# Patient Record
Sex: Female | Born: 1940 | Race: White | Hispanic: No | Smoking: Never smoker
Health system: Southern US, Community
[De-identification: ages and names within clinical notes are randomized; demographics above are authoritative.]

## PROBLEM LIST (undated history)

## (undated) DIAGNOSIS — E7800 Pure hypercholesterolemia, unspecified: Secondary | ICD-10-CM

## (undated) HISTORY — PX: APPENDECTOMY: SHX54

## (undated) HISTORY — DX: Pure hypercholesterolemia, unspecified: E78.00

## (undated) HISTORY — PX: BILATERAL ANTERIOR TOTAL HIP ARTHROPLASTY: SHX5567

---
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
## 1972-07-22 HISTORY — PX: TOTAL ABDOMINAL HYSTERECTOMY: SHX209

## 1972-07-22 HISTORY — PX: OTHER SURGICAL HISTORY: SHX169

## 2004-07-04 ENCOUNTER — Ambulatory Visit: Payer: Self-pay | Admitting: Family Medicine

## 2005-01-15 ENCOUNTER — Ambulatory Visit: Payer: Self-pay | Admitting: Family Medicine

## 2006-02-20 ENCOUNTER — Ambulatory Visit: Payer: Self-pay | Admitting: Family Medicine

## 2007-03-24 DIAGNOSIS — K5900 Constipation, unspecified: Secondary | ICD-10-CM | POA: Insufficient documentation

## 2007-04-14 ENCOUNTER — Ambulatory Visit: Payer: Self-pay | Admitting: Family Medicine

## 2007-05-05 ENCOUNTER — Ambulatory Visit: Payer: Self-pay | Admitting: General Surgery

## 2007-05-05 LAB — HM COLONOSCOPY: HM COLON: NORMAL

## 2008-05-20 ENCOUNTER — Ambulatory Visit: Payer: Self-pay | Admitting: Family Medicine

## 2008-06-22 ENCOUNTER — Ambulatory Visit: Payer: Self-pay | Admitting: Family Medicine

## 2008-07-20 ENCOUNTER — Ambulatory Visit: Payer: Self-pay | Admitting: Family Medicine

## 2009-07-27 ENCOUNTER — Ambulatory Visit: Payer: Self-pay | Admitting: Emergency Medicine

## 2009-08-10 ENCOUNTER — Ambulatory Visit: Payer: Self-pay | Admitting: Family Medicine

## 2010-08-09 ENCOUNTER — Encounter: Payer: Self-pay | Admitting: Cardiovascular Disease

## 2010-09-05 ENCOUNTER — Encounter: Payer: Self-pay | Admitting: Cardiovascular Disease

## 2010-09-05 ENCOUNTER — Ambulatory Visit (INDEPENDENT_AMBULATORY_CARE_PROVIDER_SITE_OTHER): Payer: Medicare Other | Admitting: Cardiovascular Disease

## 2010-09-05 DIAGNOSIS — R9431 Abnormal electrocardiogram [ECG] [EKG]: Secondary | ICD-10-CM

## 2010-09-05 DIAGNOSIS — Z01810 Encounter for preprocedural cardiovascular examination: Secondary | ICD-10-CM

## 2010-09-12 ENCOUNTER — Ambulatory Visit: Payer: Self-pay | Admitting: Family Medicine

## 2010-09-12 NOTE — Assessment & Plan Note (Signed)
Summary: NP6/SURGICAL CLEARANCE FOR A HIP REPLACEMENT/AMD   Visit Type:  Initial Consult Primary Provider:  Dr. Annye Asa  CC:  clearance for hip surgery in March.c/o "heart feeling slow". denies chest pain and SOB.  History of Present Illness: very pleasant 70 year old woman who presents by referral from Dr. Nilda Simmer, with abnormal EKG, atypical type chest discomfort, a strong family history of coronary artery disease who is scheduled to have a right hip replacement in the near future and requires preoperative evaluation.  Natalie Pena reports that overall she is doing well. She is typically very active though is limited by her right hip discomfort. Sometimes it does not cause significant discomfort, other times, it pops out and causes severe pain. She typically walks with a limp. She is very active around her house, and denies any significant shortness of breath or chest discomfort.  Rarely, she has episodes that she describes as a "weak heart ". She believes it is anxiety. It is not chest pain. She also has rare episodes of shortness of breath while at rest when she reads about and has to take a deep breath in.  She states her mother had significant coronary artery disease with first heart attack at age 61 though she was a smoker and diagnosed as a diabetic at that time.  Natalie Pena is unaware of her cholesterol numbers though reports she has a very elevated HDL.  EKG from August 09, 2010 shows normal sinus rhythm with rate 65 beats per minute, left axis deviation, poor R-wave progression through the anterior precordial leads.  EKG from today is essentially unchanged from above, normal sinus rhythm with rate 57 beats per minute, poor R-wave progression through the anterior precordial leads, left axis deviation  Current Medications (verified): 1)  Vitamin D 400 Unit Caps (Cholecalciferol) .... 2 Tablets Once Daily 2)  Aspir-Low 81 Mg Tbec (Aspirin) .Marland Kitchen.. 1 Tablet Once Daily 3)   Calcium-Vitamin D 600-200 Mg-Unit Tabs (Calcium-Vitamin D) .... 2 Tablets Once Daily 4)  Multivitamins  Tabs (Multiple Vitamin) .Marland Kitchen.. 1 Tablet Once Daily 5)  Advil 200 Mg Tabs (Ibuprofen) .... As Needed 6)  Fish Oil 1000 Mg Caps (Omega-3 Fatty Acids) .... 2 Tablets Once Daily 7)  Magnesium Gluconate 550 Mg Tabs (Magnesium Gluconate) .Marland Kitchen.. 1 Tablet Once Daily  Allergies (verified): 1)  ! * Shellfish  Past History:  Past Medical History: Last updated: 10/02/10 Family hx of cardiovascular disease Hypercholesterolemia Osteoporosis  Past Surgical History: Last updated: 10-02-2010 Tonsillectomy and adenoidectomy(1940) Appendectomy [phone]) Abdominal Hysterectomy-Total-1974 (fibroids) Unilateral oophorectomy(1974) left  Family History: Last updated: 10-02-10 Father: deceased-pancreatic cancer Mother: deceased-MI, CAD  Social History: Last updated: 02-Oct-2010 Retired  Widowed  Tobacco Use - No.  Alcohol Use - no Regular Exercise - yes Drug Use - no  Risk Factors: Exercise: yes (10-02-10)  Risk Factors: Smoking Status: never (October 02, 2010)  Review of Systems  The patient denies fever, weight loss, weight gain, vision loss, decreased hearing, hoarseness, chest pain, syncope, dyspnea on exertion, peripheral edema, prolonged cough, abdominal pain, incontinence, muscle weakness, depression, and enlarged lymph nodes.         rare feeling in her chest that she believes is secondary to anxiety, rare shortness of breath at rest, severe right hip pain with walking  Vital Signs:  Patient profile:   70 year old female Height:      64 inches Weight:      134.25 pounds BMI:     23.13 Pulse rate:   57 / minute BP  sitting:   164 / 82  (left arm) Cuff size:   regular  Vitals Entered By: Lysbeth Galas CMA (September 05, 2010 2:43 PM)  Physical Exam  General:  Well developed, well nourished, in no acute distress. Head:  normocephalic and atraumatic Neck:  Neck supple,  no JVD. No masses, thyromegaly or abnormal cervical nodes. Lungs:  Clear bilaterally to auscultation and percussion. Heart:  Non-displaced PMI, chest non-tender; regular rate and rhythm, S1, S2 without murmurs, rubs or gallops. Carotid upstroke normal, no bruit.  Pedals normal pulses. No edema, no varicosities. Abdomen:  Bowel sounds positive; abdomen soft and non-tender without masses Msk:  Back normal, normal gait. Muscle strength and tone normal. Pulses:  pulses normal in all 4 extremities Extremities:  No clubbing or cyanosis. Neurologic:  Alert and oriented x 3. Skin:  Intact without lesions or rashes. Psych:  Normal affect.   Impression & Recommendations:  Problem # 1:  PRE-OPERATIVE CARDIAC EXAM (ICD-V72.81) Natalie Pena would be acceptable risk for up coming right hip surgery. She does have a strong family history of coronary artery disease though her mother was a smoker and diabetic and our patient does not smoke or have diabetes diagnosed at this time. She is active with no symptoms and can easily perform more than 5 METS. She has no known coronary artery disease. Her EKG does have borderline abnormalities though nothing that would preclude her from the surgery or require additional workup.   We have suggested that she exercise as tolerated after her surgery. If she has symptoms of shortness of breath or chest discomfort, that she contact our office for further evaluation.  We have recommended that she take her aspirin through the surgery if acceptable for Dr. Hyacinth Meeker.  We will not start any beta blocker as her heart rate is already low. We will try to obtain her most recent lipid panel for our records given her strong family history.  Her updated medication list for this problem includes:    Aspir-low 81 Mg Tbec (Aspirin) .Marland Kitchen... 1 tablet once daily  Other Orders: EKG w/ Interpretation (93000)  Patient Instructions: 1)  Your physician recommends that you follow up as  needed. 2)  Your physician recommends that you continue on your current medications as directed. Please refer to the Current Medication list given to you today.

## 2010-10-02 ENCOUNTER — Ambulatory Visit: Payer: Self-pay | Admitting: Specialist

## 2010-10-09 NOTE — Letter (Signed)
Summary: [facility] Office Visit Note  [facility] Office Visit Note   Imported By: Roderic Ovens 10/02/2010 11:55:11  _____________________________________________________________________  External Attachment:    Type:   Image     Comment:   External Document

## 2010-10-10 ENCOUNTER — Inpatient Hospital Stay: Payer: Self-pay | Admitting: Specialist

## 2010-10-12 ENCOUNTER — Encounter: Payer: Self-pay | Admitting: Internal Medicine

## 2010-10-21 ENCOUNTER — Encounter: Payer: Self-pay | Admitting: Internal Medicine

## 2010-11-20 ENCOUNTER — Encounter: Payer: Self-pay | Admitting: Internal Medicine

## 2011-02-12 ENCOUNTER — Encounter: Payer: Self-pay | Admitting: Cardiovascular Disease

## 2011-11-13 ENCOUNTER — Ambulatory Visit: Payer: Self-pay | Admitting: Family Medicine

## 2011-11-13 LAB — HM DEXA SCAN

## 2011-11-14 IMAGING — CR DG CHEST 2V
1 series · 2 of 2 positions shown · non-contrast
Comparison: none

REASON FOR EXAM: occasional sob
COMMENTS:

PROCEDURE:     DXR - DXR CHEST PA (OR AP) AND LATERAL  - October 02, 2010 [DATE]
RESULT:     Comparison is made to the prior exam of 07/27/2009. The lung
fields are clear. The heart, mediastinal and osseous structures show no
significant abnormalities.

[Series 1: view not recorded · 0.17mm/px · 2 of 2 slices shown]
[im 1/2]
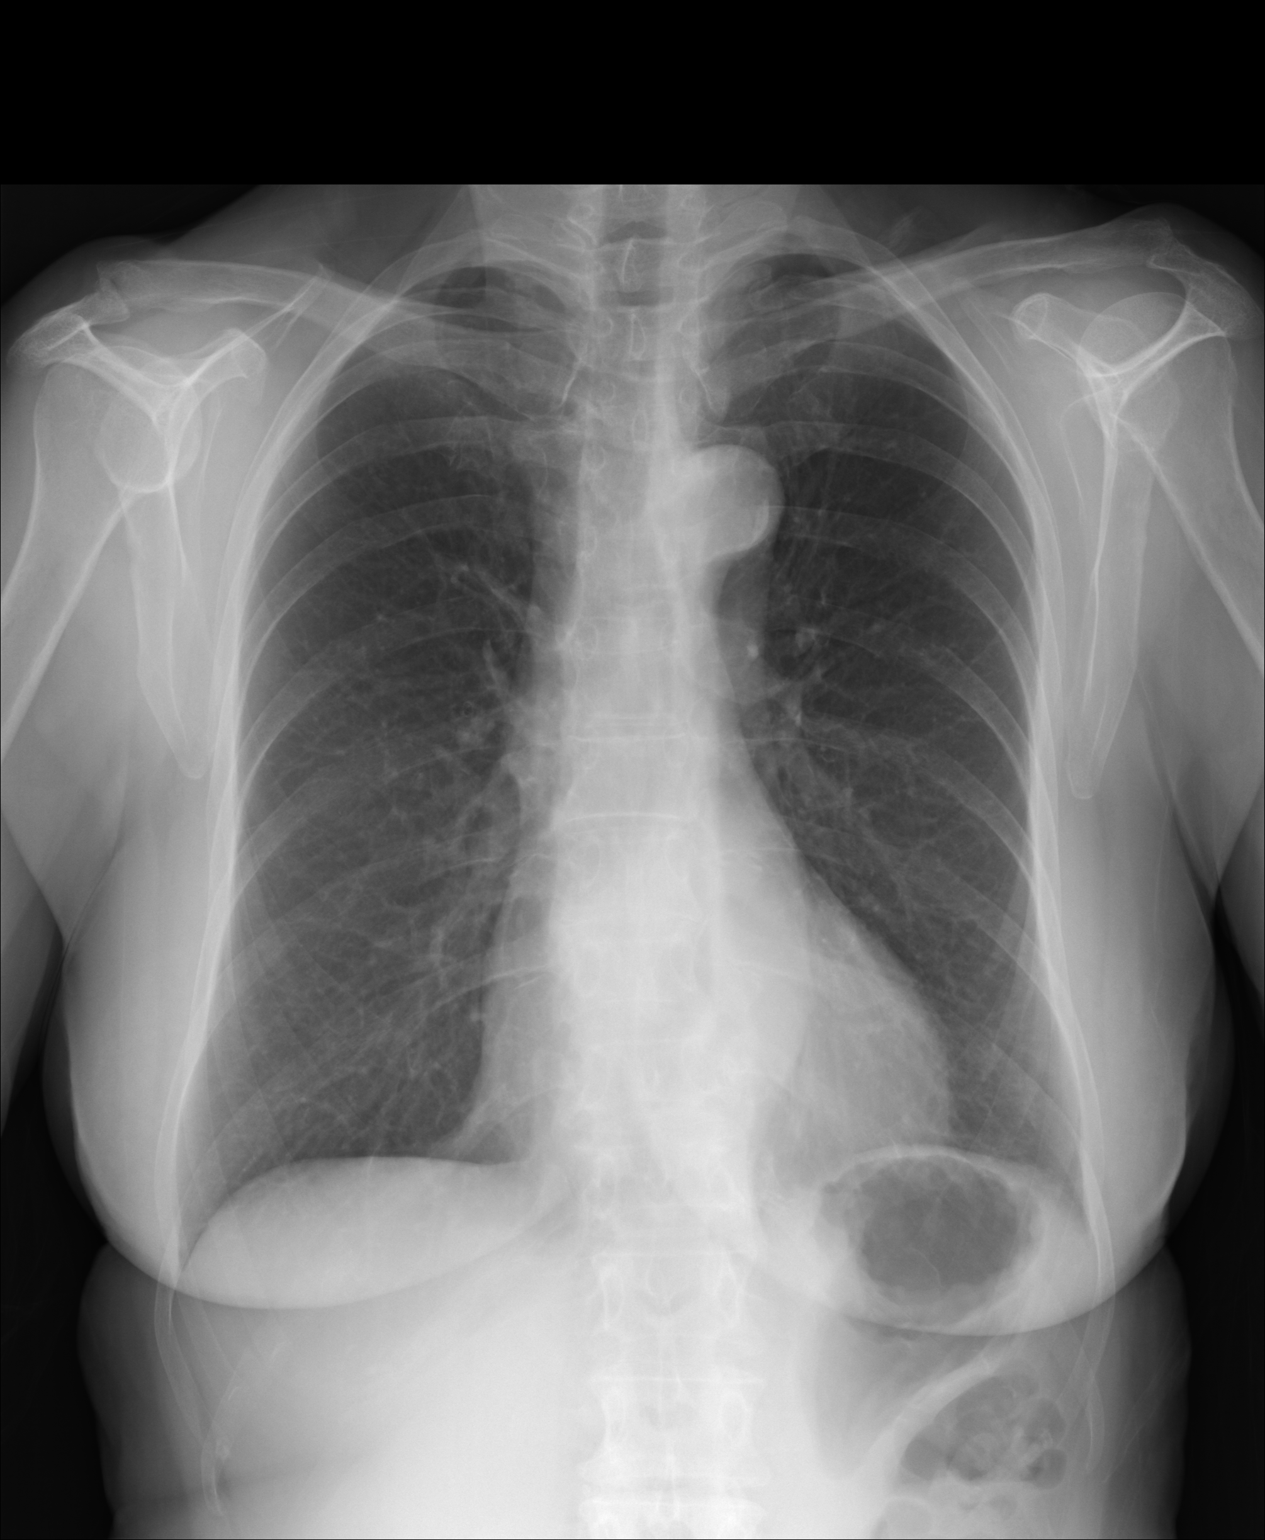
[im 2/2]
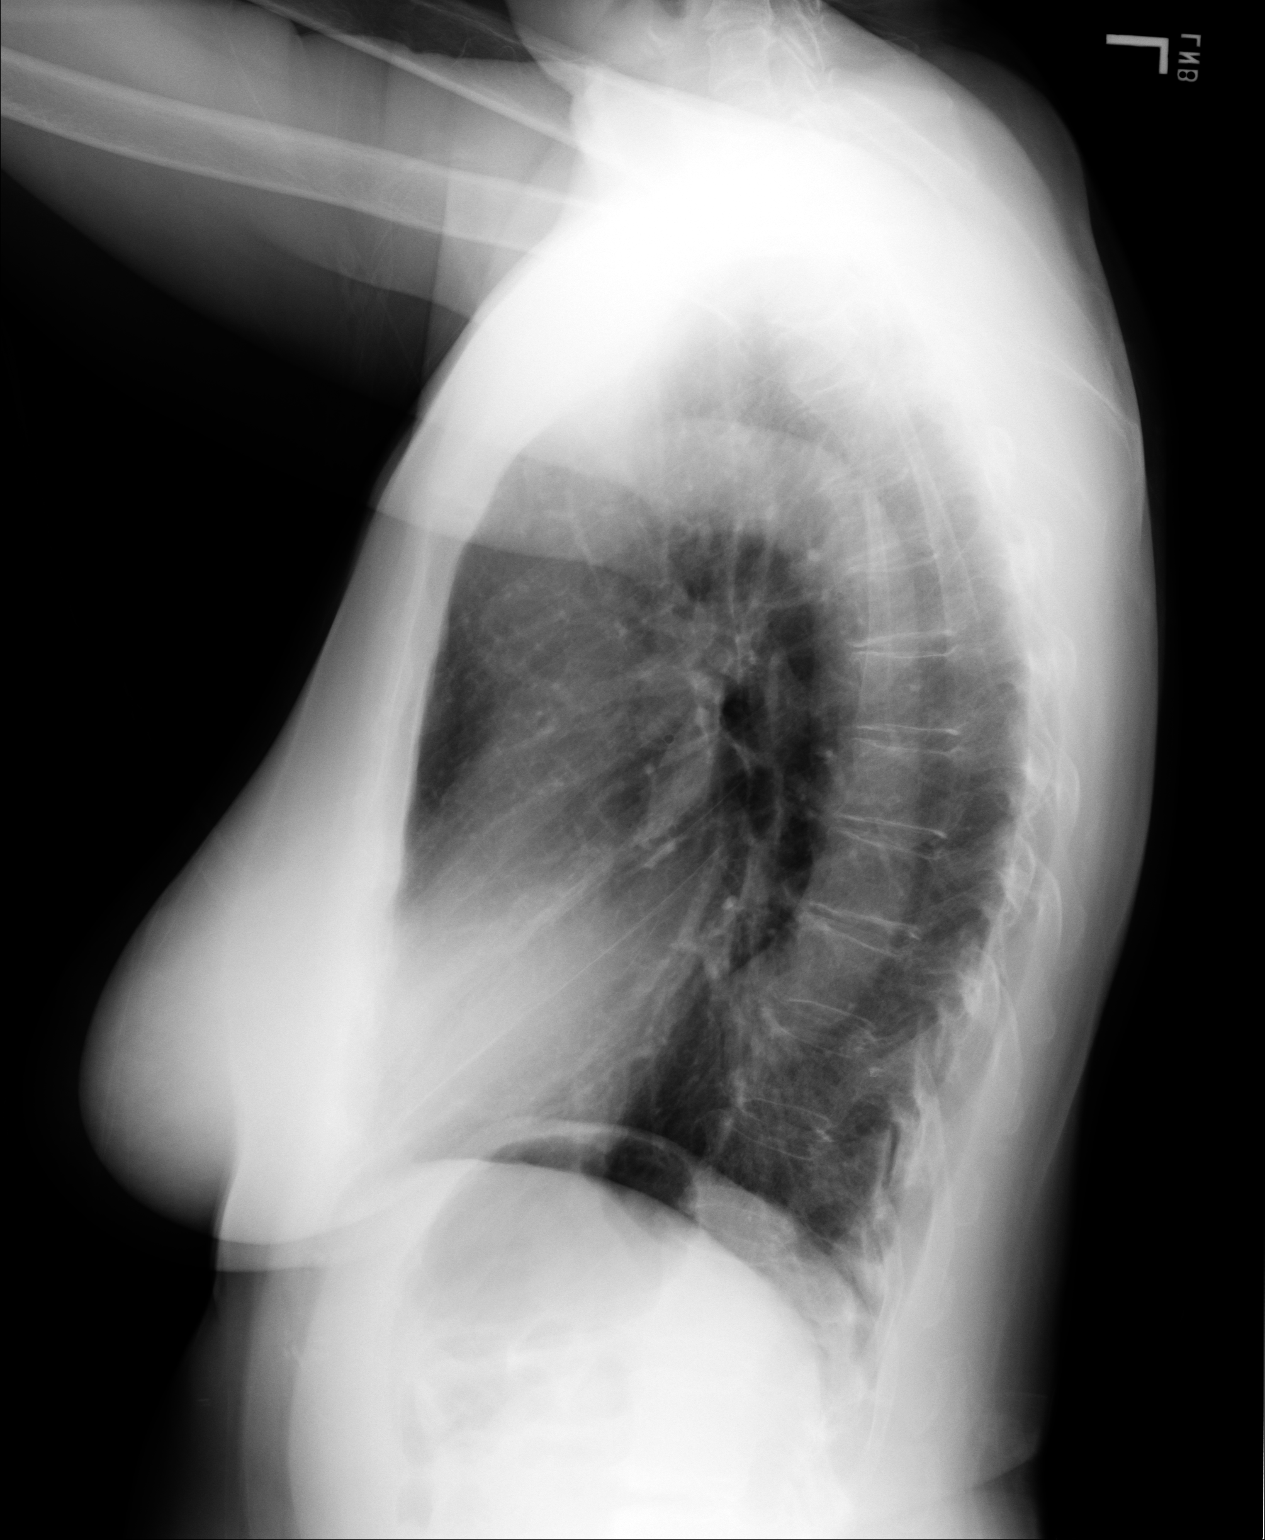

[2 of 2 positions shown; findings below may reference images not displayed]

IMPRESSION: No significant abnormalities are noted.

## 2011-11-27 ENCOUNTER — Ambulatory Visit: Payer: Self-pay | Admitting: Family Medicine

## 2013-10-27 ENCOUNTER — Ambulatory Visit: Payer: Self-pay | Admitting: Specialist

## 2013-10-27 LAB — PROTIME-INR
INR: 1
Prothrombin Time: 12.6 secs (ref 11.5–14.7)

## 2013-10-27 LAB — URINALYSIS, COMPLETE
BACTERIA: NONE SEEN
Bilirubin,UR: NEGATIVE
Blood: NEGATIVE
Glucose,UR: NEGATIVE mg/dL (ref 0–75)
Hyaline Cast: 3
KETONE: NEGATIVE
LEUKOCYTE ESTERASE: NEGATIVE
Nitrite: NEGATIVE
Ph: 5 (ref 4.5–8.0)
Protein: NEGATIVE
Specific Gravity: 1.019 (ref 1.003–1.030)
Squamous Epithelial: 1
WBC UR: 1 /HPF (ref 0–5)

## 2013-10-27 LAB — MRSA PCR SCREENING

## 2013-10-27 LAB — APTT: Activated PTT: 27.4 secs (ref 23.6–35.9)

## 2013-11-03 ENCOUNTER — Inpatient Hospital Stay: Payer: Self-pay | Admitting: Specialist

## 2013-11-04 LAB — CBC WITH DIFFERENTIAL/PLATELET
Basophil #: 0 10*3/uL (ref 0.0–0.1)
Basophil %: 0 %
EOS ABS: 0 10*3/uL (ref 0.0–0.7)
Eosinophil %: 0 %
HCT: 35.3 % (ref 35.0–47.0)
HGB: 11.5 g/dL — AB (ref 12.0–16.0)
LYMPHS ABS: 1.4 10*3/uL (ref 1.0–3.6)
LYMPHS PCT: 9.9 %
MCH: 29.8 pg (ref 26.0–34.0)
MCHC: 32.6 g/dL (ref 32.0–36.0)
MCV: 91 fL (ref 80–100)
MONOS PCT: 9.6 %
Monocyte #: 1.4 x10 3/mm — ABNORMAL HIGH (ref 0.2–0.9)
NEUTROS ABS: 11.6 10*3/uL — AB (ref 1.4–6.5)
Neutrophil %: 80.5 %
Platelet: 190 10*3/uL (ref 150–440)
RBC: 3.86 10*6/uL (ref 3.80–5.20)
RDW: 13.9 % (ref 11.5–14.5)
WBC: 14.4 10*3/uL — AB (ref 3.6–11.0)

## 2013-11-04 LAB — BASIC METABOLIC PANEL
Anion Gap: 3 — ABNORMAL LOW (ref 7–16)
BUN: 10 mg/dL (ref 7–18)
CALCIUM: 8.1 mg/dL — AB (ref 8.5–10.1)
CO2: 29 mmol/L (ref 21–32)
Chloride: 104 mmol/L (ref 98–107)
Creatinine: 0.5 mg/dL — ABNORMAL LOW (ref 0.60–1.30)
EGFR (African American): >60
EGFR (Non-African Amer.): >60
Glucose: 107 mg/dL — ABNORMAL HIGH (ref 65–99)
OSMOLALITY: 271 (ref 275–301)
Potassium: 4.5 mmol/L (ref 3.5–5.1)
Sodium: 136 mmol/L (ref 136–145)

## 2013-11-05 LAB — PATHOLOGY REPORT

## 2013-11-05 LAB — HEMOGLOBIN: HGB: 12.2 g/dL (ref 12.0–16.0)

## 2013-11-07 ENCOUNTER — Encounter: Payer: Self-pay | Admitting: Internal Medicine

## 2013-11-19 ENCOUNTER — Encounter: Payer: Self-pay | Admitting: Internal Medicine

## 2014-02-09 ENCOUNTER — Ambulatory Visit: Payer: Self-pay | Admitting: Family Medicine

## 2014-02-09 LAB — HM MAMMOGRAPHY

## 2014-04-20 ENCOUNTER — Ambulatory Visit: Payer: Self-pay | Admitting: Ophthalmology

## 2014-04-28 ENCOUNTER — Ambulatory Visit: Payer: Self-pay | Admitting: Family Medicine

## 2014-05-04 LAB — CBC AND DIFFERENTIAL
HCT: 255 % — AB (ref 36–46)
Hemoglobin: 14.2 g/dL (ref 12.0–16.0)
WBC: 10.4 10^3/mL

## 2014-05-22 HISTORY — PX: EYE SURGERY: SHX253

## 2014-06-01 ENCOUNTER — Ambulatory Visit: Payer: Self-pay | Admitting: Ophthalmology

## 2014-07-28 DIAGNOSIS — Z961 Presence of intraocular lens: Secondary | ICD-10-CM | POA: Diagnosis not present

## 2014-10-26 DIAGNOSIS — Z96643 Presence of artificial hip joint, bilateral: Secondary | ICD-10-CM | POA: Diagnosis not present

## 2014-11-12 NOTE — H&P (Signed)
   Subjective/Chief Complaint Left hip pain   History of Present Illness 74 year old female has severe osteoarthritis of the left hip which has not been controlled by NSAIDs, walking aids, rest, or exercise.  She had successful right total hip replacement several years ago and wishes to proceed with left total hip replacement now.  Risks and benefits of surgery were discussed at length including but not limited to infection, non union, nerve or blood vessed damage, non union, need for repeat surgery, blood clots and lung emboli, and death.   Past Med/Surgical Hx:  occ "heart skips":   Hip Replacement - Right:   hysterectomy:   c-section:   appendectomy:   tonsillectomy:   tonsillectomy:   ALLERGIES:  Shellfish: N/V/Diarrhea  Family and Social History:  Family History Non-Contributory   Social History negative tobacco, negative ETOH   Place of Living Home   Review of Systems:  Fever/Chills No   Cough No   Sputum No   Abdominal Pain No   Physical Exam:  GEN well developed, well nourished, thin   HEENT pink conjunctivae   NECK supple   RESP normal resp effort   CARD regular rate   ABD denies tenderness   LYMPH negative neck   EXTR negative edema, Left hip range of motion decreased to 5* internal rotation and 10* external rotation.  Leg lengths are equal.  Severe pain with rotation of hip.  circulation/sensation/motor function good distally.   SKIN normal to palpation   NEURO motor/sensory function intact   PSYCH alert    Assessment/Admission Diagnosis Severe osteoarthritis left hip   Plan Left total hip replacement.   Electronic Signatures: Valinda HoarMiller, Cortlan Dolin E (MD)  (Signed 14-Apr-15 17:08)  Authored: CHIEF COMPLAINT and HISTORY, PAST MEDICAL/SURGIAL HISTORY, ALLERGIES, FAMILY AND SOCIAL HISTORY, REVIEW OF SYSTEMS, PHYSICAL EXAM, ASSESSMENT AND PLAN   Last Updated: 14-Apr-15 17:08 by Valinda HoarMiller, Adeli Frost E (MD)

## 2014-11-12 NOTE — Discharge Summary (Signed)
PATIENT NAME:  Natalie Pena, Natalie Pena MR#:  [id number] DATE OF BIRTH:  Apr 11, 1941  DATE OF ADMISSION:  11/03/2013 DATE OF DISCHARGE:  11/06/2013   FINAL DIAGNOSES:  1. Advanced osteoarthritis of the left hip.  2. Occasional irregular heartbeat.  OPERATION: Cementless AML left total hip replacement, 11/03/2013.   COMPLICATIONS: None.  CONSULTATIONS: None.   DISCHARGE MEDICATIONS:  1. Enteric-coated aspirin 1 p.o. b.i.d. 2. Norco 5/325 p.r.n. pain. 3. Iron 1 p.o. daily. 4. Surfak 250 mg daily at bedtime.  5. Vitamin D3 once a day.  6. Calcium with vitamin D once a day.    HISTORY OF PRESENT ILLNESS: The patient is a 74 year old female with advanced osteoarthritis of the left hip. She has had long-term treatment with anti-inflammatory medications, exercise, rest and supportive walking measures. She had a right total hip replacement several years ago, and she has done well. She wishes to proceed with left total hip replacement. The risks and benefits and postoperative protocol were discussed with the patient and are well-known to her. She did go to a skilled nursing rehab following her previous surgery and would like to do so again.   PAST MEDICAL HISTORY: Illnesses: As above.   PAST SURGICAL HISTORY: Right total hip replacement, hysterectomy, C-section, appendix and tonsillectomy.   ALLERGIES: SHELLFISH.   FAMILY HISTORY: Unremarkable.   SOCIAL HISTORY: The patient does not smoke or drink. She lives at home by herself.   REVIEW OF SYSTEMS: Unremarkable.   PHYSICAL EXAMINATION:  GENERAL: The patient was alert and cooperative.  VITAL SIGNS: Normal.  MUSCULOSKELETAL: The left hip showed flexion to 80 degrees, with only 5 degrees of internal rotation, 10 degrees of external rotation and extreme pain. Leg length showed slight lengthening on the left compared to the right. The right had full range of motion without pain.   LABORATORY DATA ON ADMISSION: Satisfactory.   HOSPITAL COURSE: On  11/03/2013, the patient underwent a cementless left total hip replacement. She did very well postoperatively, with no significant problems other than some transient orthopnea when she stood up. Her hemoglobin was 12.2 on the second postoperative day. Drains have been removed and Foley removed. Dressings were changed. She is stable and ready for skilled nursing discharge on 11/06/2013. She will be seen in my office in 2 weeks for exam. Rehabilitation potential is excellent, and she will be partial weight-bearing on a walker.    ____________________________ Valinda HoarHoward E. Melia Hopes, MD hem:lb D: 11/05/2013 13:56:34 ET T: 11/05/2013 14:07:30 ET JOB#: [id number]  cc: Valinda HoarHoward E. Alucard Fearnow, MD, <Dictator> Myrle ShengKristi M. Katrinka BlazingSmith, MD Valinda HoarHOWARD E Ebin Palazzi MD ELECTRONICALLY SIGNED 11/07/2013 11:53

## 2014-11-12 NOTE — Op Note (Signed)
PATIENT NAME:  Natalie Pena, Natalie Pena MR#:  [id number] DATE OF BIRTH:  06/07/1941  DATE OF PROCEDURE:  11/03/2013  PREOPERATIVE DIAGNOSIS: Advanced osteoarthritis of the left hip.   POSTOPERATIVE DIAGNOSIS: Advanced osteoarthritis of the left hip.  PROCEDURE PERFORMED: Left cementless AML total hip replacement (13.5 mm narrow AML femoral stem, -2 mm neck length, 36 mm metal head, 52 mm series 300 cup and +4 mm neutral polyethylene liner).   SURGEON: Natalie Pena, M.D.   ASSISTANT: Dr. Martha Clan.   ANESTHESIA: Spinal.   COMPLICATIONS: None.   DRAINS: Two Autovacs.   ESTIMATED BLOOD LOSS: 200 mL.  REPLACED: None.   DESCRIPTION OF PROCEDURE: The patient was brought to the operating room where she underwent satisfactory spinal anesthesia and was placed in a right lateral decubitus position and padded and positioned appropriately on the operating table. The left leg was prepped and draped in sterile fashion and a posterolateral incision made over the hip. Dissection was carried out sharply through subcutaneous tissue. Electrocautery was used for hemostasis. Adson-Beckman retractor was inserted and the deep fascia was incised in line with the femur. The Charnley retractors were then inserted and the posterior aspect of the hip was exposed. The sciatic nerve was seen to be quite prominent in the field right behind the acetabulum. This was carefully protected throughout the case. We did not use electrocautery near it. The short external rotators were removed and tagged. The posterior capsule was incised in a T fashion and tagged. The femoral head was then rotated and dislocated. The soft tissue was removed from the base of the neck and an initiating reamer used to open the proximal femur. A guide rod was then advanced down the shaft to open up the canal. The canal was then reamed to 13 mm. The femoral neck was cut at appropriate angle about a fingerbreadth above the lesser trochanter. The femur was then  retracted and the acetabulum debrided free of soft tissue around the edges. The acetabulum was then reamed to 51 mm starting at 45 mm moving upwards gradually. Trial acetabular component was put in place along with a neutral +4 liner. The femur was then rasped and a narrow 13 mm rasp inserted fully. The trial reductions were carried out using a +2 and -2 neck lengths.   Prior to dislocating the hip, a Steinmann pin was placed in the pelvis and bent over and the greater trochanter tagged with a suture to establish the initial leg length. Preoperatively, she was just a couple millimeters long on the left side. The -2 mm neck length appeared to be the most appropriate leg length with good range of motion and good stability. All trials were removed and a 52 mm series 300, 3 spike cup was inserted in about 45 degrees of abduction and 25 degrees of anteversion. The gluteus screw was placed in the apex of the cup. Neutral +4 mm liner was inserted. A 13.5 narrow femoral AML stem was then inserted fully. The trial reduction was carried out again using the neck lengths and again the -2 neck lengths seemed to be the best. A 36 mm head with -2 neck length was then applied to the stem and the hip was reduced one final time as leg lengths were excellent and the range of motion was good and very stable. Irrigation with pulse lavage irrigation was carried out throughout the procedure. Further irrigation was then used. The posterior capsule was closed with #2 Tycron sutures as was the short external rotators.  Autovac drain was placed deep to the fascia and the deep fascia was then closed with #2 Quill. The subcutaneous tissue was closed with 0 Quill over another Autovac drain. The skin was closed with staples. Dry sterile dressing along with TENS pads were applied and the Autovac was activated. The patient was then transferred onto her back and onto the hospital bed. The hip was stable and leg lengths were excellent. She was  transported to the recovery room in good condition.   ____________________________ Natalie HoarHoward E. Ahyan Kreeger, MD hem:aw D: 11/03/2013 10:44:50 ET T: 11/03/2013 11:14:01 ET JOB#: [id number]  cc: Natalie HoarHoward E. Aeriana Speece, MD, <Dictator> Natalie HoarHOWARD E Elden Brucato MD ELECTRONICALLY SIGNED 11/04/2013 17:12

## 2014-11-12 NOTE — Op Note (Signed)
PATIENT NAME:  Natalie Pena, Natalie Pena MR#:  [id number] DATE OF BIRTH:  02-20-1941  DATE OF PROCEDURE:  06/01/2014  PREOPERATIVE DIAGNOSIS:  Nuclear sclerotic cataract left eye.  ICD-10 H25.12  POSTOPERATIVE DIAGNOSIS:  Nuclear sclerotic cataract left eye.  PROCEDURE:  Phacoemulsification with posterior chamber intraocular lens implantation of the left eye.   LENS:  ZCB00 20.5-diopter posterior chamber intraocular lens.  ULTRASOUND TIME:  18% of 1 minute, 45 seconds.  CDE 19.3.  SURGEON:  [place]had Caroleen Stoermer, MD  ANESTHESIA:  Topical with tetracaine drops and 2% Xylocaine jelly.  COMPLICATIONS:  None.  DESCRIPTION OF PROCEDURE:  The patient was identified in the holding room and transported to the operating room and placed in the supine position under the operating microscope.  The left eye was identified as the operative eye and it was prepped and draped in the usual sterile ophthalmic fashion.  A 1 millimeter clear-corneal paracentesis was made at the 1:30 position.  The anterior chamber was filled with Viscoat viscoelastic.  A 2.4 millimeter keratome was used to make a near-clear corneal incision at the 10:30 position.  A curvilinear capsulorrhexis was made with a cystotome and capsulorrhexis forceps.  Balanced salt solution was used to hydrodissect and hydrodelineate the nucleus.  Phacoemulsification was then used in stop and chop fashion to remove the lens nucleus and epinucleus.  The remaining cortex was then removed using the irrigation and aspiration handpiece. Provisc was then placed into the capsular bag to distend it for lens placement.  A ZCB00 20.5-diopter lens was then injected into the capsular bag.  The remaining viscoelastic was aspirated.  Wounds were hydrated with balanced salt solution.  The anterior chamber was inflated to a physiologic pressure with balanced salt solution.  Cefuroxime 10 mg/mL, 0.1 mL was injected into the anterior chamber for a dose of 1 mg intracameral antibiotic  at the completion of the case. Miostat was placed into the anterior chamber to constrict the pupil.  No wound leaks were noted.  Topical Vigamox drops and Maxitrol ointment were applied to the eye.  The patient was taken to the recovery room in stable condition without complications of anesthesia or surgery.   ____________________________ Deirdre Evenerhadwick R. Damoni Causby, MD crb:LT D: 06/01/2014 14:52:30 ET T: 06/01/2014 16:59:28 ET JOB#: [id number]  cc: Deirdre Evenerhadwick R. Klayten Jolliff, MD, <Dictator> Lockie MolaHADWICK Delron Comer MD ELECTRONICALLY SIGNED 06/08/2014 11:31

## 2014-11-23 DIAGNOSIS — E559 Vitamin D deficiency, unspecified: Secondary | ICD-10-CM | POA: Insufficient documentation

## 2014-11-23 DIAGNOSIS — H9190 Unspecified hearing loss, unspecified ear: Secondary | ICD-10-CM | POA: Insufficient documentation

## 2014-11-23 DIAGNOSIS — R42 Dizziness and giddiness: Secondary | ICD-10-CM | POA: Insufficient documentation

## 2014-11-23 DIAGNOSIS — R945 Abnormal results of liver function studies: Secondary | ICD-10-CM | POA: Insufficient documentation

## 2014-11-23 DIAGNOSIS — R7989 Other specified abnormal findings of blood chemistry: Secondary | ICD-10-CM | POA: Insufficient documentation

## 2014-11-23 DIAGNOSIS — D72820 Lymphocytosis (symptomatic): Secondary | ICD-10-CM | POA: Insufficient documentation

## 2014-11-23 DIAGNOSIS — IMO0001 Reserved for inherently not codable concepts without codable children: Secondary | ICD-10-CM | POA: Insufficient documentation

## 2014-11-23 DIAGNOSIS — R739 Hyperglycemia, unspecified: Secondary | ICD-10-CM | POA: Insufficient documentation

## 2015-01-25 ENCOUNTER — Encounter: Payer: Self-pay | Admitting: Family Medicine

## 2015-01-25 ENCOUNTER — Ambulatory Visit (INDEPENDENT_AMBULATORY_CARE_PROVIDER_SITE_OTHER): Payer: Medicare Other | Admitting: Family Medicine

## 2015-01-25 VITALS — BP 110/62 | HR 72 | Temp 98.7°F | Resp 16 | Ht 63.5 in | Wt 135.0 lb

## 2015-01-25 DIAGNOSIS — E7800 Pure hypercholesterolemia, unspecified: Secondary | ICD-10-CM

## 2015-01-25 DIAGNOSIS — L259 Unspecified contact dermatitis, unspecified cause: Secondary | ICD-10-CM | POA: Diagnosis not present

## 2015-01-25 DIAGNOSIS — Z0000 Encounter for general adult medical examination without abnormal findings: Secondary | ICD-10-CM

## 2015-01-25 DIAGNOSIS — Z1211 Encounter for screening for malignant neoplasm of colon: Secondary | ICD-10-CM | POA: Diagnosis not present

## 2015-01-25 DIAGNOSIS — R945 Abnormal results of liver function studies: Secondary | ICD-10-CM

## 2015-01-25 DIAGNOSIS — R7989 Other specified abnormal findings of blood chemistry: Secondary | ICD-10-CM

## 2015-01-25 DIAGNOSIS — R739 Hyperglycemia, unspecified: Secondary | ICD-10-CM

## 2015-01-25 DIAGNOSIS — E559 Vitamin D deficiency, unspecified: Secondary | ICD-10-CM | POA: Diagnosis not present

## 2015-01-25 DIAGNOSIS — Z1239 Encounter for other screening for malignant neoplasm of breast: Secondary | ICD-10-CM | POA: Diagnosis not present

## 2015-01-25 LAB — IFOBT (OCCULT BLOOD): IFOBT: NEGATIVE

## 2015-01-25 NOTE — Progress Notes (Signed)
Patient ID: Natalie Pena, female   DOB: 13-May-1941, 74 y.o.   MRN: [id number]       Patient: Natalie Pena, Female    DOB: 1940-09-20, 74 y.o.   MRN: [id number] Visit Date: 01/25/2015  Today's Provider: Lorie Phenix, MD   Chief Complaint  Patient presents with  . Medicare Wellness   Subjective:    Annual wellness visit Natalie Pena is a 74 y.o. female who presents today for her Subsequent Annual Wellness Visit. She feels well with minor complaint of a rash on several areas of her body on and off x's 3 months. She reports exercising every morning. She reports she is sleeping well, pt reports she gets about 6 hours of sleep. Last CPE: 12/29/13 02/09/14  BI-RADS 1 11/13/11  BMD-Osteoporosis 05/05/07 Colonoscopy-normal  Lab Results  Component Value Date   WBC 10.4 05/04/2014   HGB 14.2 05/04/2014   HCT 255* 05/04/2014   PLT 190 11/04/2013   GLUCOSE 107* 11/04/2013   NA 136 11/04/2013   K 4.5 11/04/2013   CL 104 11/04/2013   CREATININE 0.50* 11/04/2013   BUN 10 11/04/2013   CO2 29 11/04/2013   INR 1.0 10/27/2013     -----------------------------------------------------------   Review of Systems  HENT: Negative.   Eyes: Negative.   Cardiovascular: Negative.   Gastrointestinal: Negative.   Endocrine: Negative.   Genitourinary: Negative.   Musculoskeletal: Negative.   Skin: Positive for rash.  Allergic/Immunologic: Negative.   Neurological: Negative.   Hematological: Negative.   Psychiatric/Behavioral: Negative.     History   Social History  . Marital Status: Widowed    Spouse Name: N/A  . Number of Children: 2  . Years of Education: N/A   Occupational History  . retired    Social History Main Topics  . Smoking status: Never Smoker   . Smokeless tobacco: Never Used     Comment: tobacco use - no  . Alcohol Use: No  . Drug Use: No  . Sexual Activity: Not on file   Other Topics Concern  . Not on file   Social History Narrative   Widowed,  retired, gets regular exercise.     Patient Active Problem List   Diagnosis Date Noted  . Abnormal LFTs 11/23/2014  . Gravida 0 11/23/2014  . Difficulty hearing 11/23/2014  . Blood glucose elevated 11/23/2014  . Elevated lymphocyte count 11/23/2014  . Head revolving around 11/23/2014  . Avitaminosis D 11/23/2014  . Routine general medical examination at a health care facility 07/27/2009  . OP (osteoporosis) 06/15/2008  . Hypercholesteremia 09/21/2007  . Family history of cardiovascular disease 03/24/2007  . Atonic constipation 03/24/2007    Past Surgical History  Procedure Laterality Date  . Tonsillectomy and adenoidectomy  1940  . Appendectomy    . Cesarean section  1973  . Total abdominal hysterectomy  1974    fibroids  . Unilateral oophorectomy  1974    left  . Eye surgery Left 05/2014    cataract surgery    Her family history is not on file.    Previous Medications   AMOXICILLIN-CLAVULANATE (AUGMENTIN) 875-125 MG PER TABLET    Take 1 tablet by mouth 2 (two) times daily.   ASPIRIN 81 MG EC TABLET    Take 81 mg by mouth daily.     CALCIUM CARBONATE-VITAMIN D (CALCIUM-VITAMIN D) 600-200 MG-UNIT CAPS    Take 2 capsules by mouth daily.     CYANOCOBALAMIN (VITAMIN B-12) 500 MCG SUBL  Place under the tongue.   FLAXSEED, LINSEED, PO    Take by mouth.   IBUPROFEN (ADVIL,MOTRIN) 200 MG TABLET    Take 200 mg by mouth every 6 (six) hours as needed.     LUTEIN 6 MG CAPS    Take by mouth.   MAGNESIUM GLUCONATE 550 MG TABS    Take 1 tablet by mouth daily.     MECLIZINE (ANTIVERT) 25 MG TABLET    Take by mouth.   MULTIPLE VITAMIN (MULTIVITAMIN) TABLET    Take 1 tablet by mouth daily.     OMEGA-3 FATTY ACIDS (FISH OIL) 1000 MG CAPS    Take 2 capsules by mouth daily.     VITAMIN D, CHOLECALCIFEROL, 400 UNITS TABLET    Take 800 Units by mouth daily.      Patient Care Team: Natalie PhenixNancy Renny Gunnarson, MD as PCP - General (Family Medicine)     Objective:   Vitals: BP 110/62 mmHg  Pulse 72   Temp(Src) 98.7 F (37.1 C) (Oral)  Resp 16  Ht 5' 3.5" (1.613 m)  Wt 135 lb (61.236 kg)  BMI 23.54 kg/m2  SpO2 96%  Physical Exam  Constitutional: She is oriented to person, place, and time. She appears well-developed and well-nourished.  HENT:  Head: Normocephalic and atraumatic.  Right Ear: Tympanic membrane, external ear and ear canal normal.  Left Ear: Tympanic membrane, external ear and ear canal normal.  Nose: Nose normal.  Mouth/Throat: Uvula is midline, oropharynx is clear and moist and mucous membranes are normal.  Eyes: Conjunctivae, EOM and lids are normal. Pupils are equal, round, and reactive to light.  Neck: Trachea normal and normal range of motion. Neck supple. Carotid bruit is not present. No thyroid mass and no thyromegaly present.  Cardiovascular: Normal rate, regular rhythm and normal heart sounds.   Pulmonary/Chest: Effort normal and breath sounds normal.  Abdominal: Soft. Normal appearance and bowel sounds are normal. There is no hepatosplenomegaly. There is no tenderness.  Genitourinary: No breast swelling, tenderness or discharge.  Musculoskeletal: Normal range of motion.  Lymphadenopathy:    She has no cervical adenopathy.    She has no axillary adenopathy.  Neurological: She is alert and oriented to person, place, and time. She has normal strength. No cranial nerve deficit.  Skin: Skin is warm, dry and intact. Rash noted. There is erythema (multiple patches of dry areas on thighs and right hip).  Psychiatric: She has a normal mood and affect. Her speech is normal and behavior is normal. Judgment and thought content normal. Cognition and memory are normal.    Activities of Daily Living In your present state of health, do you have any difficulty performing the following activities: 01/25/2015  Hearing? N  Vision? N  Difficulty concentrating or making decisions? N  Walking or climbing stairs? N  Dressing or bathing? N  Doing errands, shopping? N    Fall  Risk Assessment Fall Risk  01/25/2015  Falls in the past year? No     Depression Screen PHQ 2/9 Scores 01/25/2015  PHQ - 2 Score 0    Cognitive Testing - 6-CIT  Correct? Score   What year is it? yes 0 0 or 4  What month is it? yes 0 0 or 3  Memorize:    Natalie Pena,  [place],  40,  [address],  [place],      What time is it? (within 1 hour) yes 0 0 or 3  Count backwards from 20 yes 0 0, 2, or  4  Name the months of the year yes 0 0, 2, or 4  Repeat name & address above yes 2 0, 2, 4, 6, 8, or 10       TOTAL SCORE  2/28   Interpretation:  Normal  Normal (0-7) Abnormal (8-28)       Assessment & Plan:     Annual Wellness Visit  Reviewed patient's Family Medical History Reviewed and updated list of patient's medical providers Assessment of cognitive impairment was done Assessed patient's functional ability Established a written schedule for health screening services Health Risk Assessment Completed and Reviewed  Exercise Activities and Dietary recommendations Goals    . Exercise 150 minutes per week (moderate activity)       Immunization History  Administered Date(s) Administered  . Pneumococcal Conjugate-13 12/29/2013  . Pneumococcal Polysaccharide-23 07/22/2005  . Tdap 08/09/2010    Health Maintenance  Topic Date Due  . ZOSTAVAX  02/04/1999  . INFLUENZA VACCINE  02/20/2015  . COLONOSCOPY  05/04/2017  . TETANUS/TDAP  08/09/2020  . DEXA SCAN  Completed  . PNA vac Low Risk Adult  Completed      1. Medicare annual wellness visit, subsequent Stable. Patient advised to continue eating healthy and exercise daily.  2. Breast cancer screening - MM DIGITAL SCREENING BILATERAL; Future  3. Colon cancer screening - IFOBT POC (occult bld, rslt in office)  4. Abnormal LFTs - Comprehensive metabolic panel  5. Hypercholesteremia - Lipid Panel With LDL/HDL Ratio  6. Blood glucose elevated - Hemoglobin A1c  7. Avitaminosis D - Vit D  25 hydroxy (rtn osteoporosis  monitoring)  8. Contact dermatitis New problem. Patient referred to dermatology as below for evaluation and treatment. - Ambulatory referral to Dermatology     Patient seen and examined by Dr. Leo Grosser, and note scribed by Liz Beach. Dimas, CMA.  I have reviewed the document for accuracy and completeness and I agree with above. Leo Grosser, MD   Natalie Phenix, MD        ------------------------------------------------------------------------------------------------------------

## 2015-01-26 ENCOUNTER — Telehealth: Payer: Self-pay

## 2015-01-26 LAB — COMPREHENSIVE METABOLIC PANEL
ALT: 15 IU/L (ref 0–32)
AST: 16 IU/L (ref 0–40)
Albumin/Globulin Ratio: 1.6 (ref 1.1–2.5)
Albumin: 4.6 g/dL (ref 3.5–4.8)
Alkaline Phosphatase: 60 IU/L (ref 39–117)
BUN/Creatinine Ratio: 17 (ref 11–26)
BUN: 11 mg/dL (ref 8–27)
Bilirubin Total: 0.4 mg/dL (ref 0.0–1.2)
CALCIUM: 10.1 mg/dL (ref 8.7–10.3)
CHLORIDE: 98 mmol/L (ref 97–108)
CO2: 25 mmol/L (ref 18–29)
CREATININE: 0.66 mg/dL (ref 0.57–1.00)
GFR calc Af Amer: 100 mL/min/{1.73_m2} (ref >59)
GFR calc non Af Amer: 87 mL/min/{1.73_m2} (ref >59)
Globulin, Total: 2.8 g/dL (ref 1.5–4.5)
Glucose: 111 mg/dL — ABNORMAL HIGH (ref 65–99)
Potassium: 4.7 mmol/L (ref 3.5–5.2)
Sodium: 140 mmol/L (ref 134–144)
Total Protein: 7.4 g/dL (ref 6.0–8.5)

## 2015-01-26 LAB — LIPID PANEL WITH LDL/HDL RATIO
Cholesterol, Total: 285 mg/dL — ABNORMAL HIGH (ref 100–199)
HDL: 126 mg/dL (ref >39)
LDL Calculated: 135 mg/dL — ABNORMAL HIGH (ref 0–99)
LDL/HDL RATIO: 1.1 ratio (ref 0.0–3.2)
TRIGLYCERIDES: 119 mg/dL (ref 0–149)
VLDL CHOLESTEROL CAL: 24 mg/dL (ref 5–40)

## 2015-01-26 LAB — VITAMIN D 25 HYDROXY (VIT D DEFICIENCY, FRACTURES): Vit D, 25-Hydroxy: 34.3 ng/mL (ref 30.0–100.0)

## 2015-01-26 LAB — HEMOGLOBIN A1C
ESTIMATED AVERAGE GLUCOSE: 123 mg/dL
Hgb A1c MFr Bld: 5.9 % — ABNORMAL HIGH (ref 4.8–5.6)

## 2015-01-26 NOTE — Telephone Encounter (Signed)
Pt advised as directed below.   Thanks,   -Ruble Buttler  

## 2015-01-26 NOTE — Telephone Encounter (Signed)
-----   Message from Lorie PhenixNancy Maloney, MD sent at 01/26/2015 10:57 AM EDT ----- Labs stable. Cholesterol is very high, but has highest "good" cholesterol I have ever seen. No good data as what to do in this case. Recommend to eat healthy and exercise and recheck annually. Thanks.

## 2015-02-15 ENCOUNTER — Ambulatory Visit: Payer: Self-pay

## 2015-03-01 ENCOUNTER — Ambulatory Visit: Payer: Self-pay

## 2015-03-15 ENCOUNTER — Ambulatory Visit
Admission: RE | Admit: 2015-03-15 | Discharge: 2015-03-15 | Disposition: A | Discharge status: Home / Self Care | Payer: Medicare Other | Source: Ambulatory Visit | Attending: Family Medicine | Admitting: Family Medicine

## 2015-03-15 ENCOUNTER — Other Ambulatory Visit: Payer: Self-pay | Admitting: Family Medicine

## 2015-03-15 DIAGNOSIS — Z1239 Encounter for other screening for malignant neoplasm of breast: Secondary | ICD-10-CM

## 2015-03-15 DIAGNOSIS — Z1231 Encounter for screening mammogram for malignant neoplasm of breast: Secondary | ICD-10-CM | POA: Insufficient documentation

## 2015-04-26 DIAGNOSIS — Z23 Encounter for immunization: Secondary | ICD-10-CM | POA: Diagnosis not present

## 2015-06-10 IMAGING — US ABDOMEN ULTRASOUND
1 series · 13 of 25 positions shown · non-contrast
Comparison: No priors.

CLINICAL DATA: 73-year-old female with elevated liver enzymes, and
mild left upper quadrant abdominal pain for the past 1-2 weeks.

EXAM:
ULTRASOUND ABDOMEN COMPLETE

[Series 1: abdomen ultrasound · 0.18mm/px · 13 of 77 slices shown]
[im 1/77]
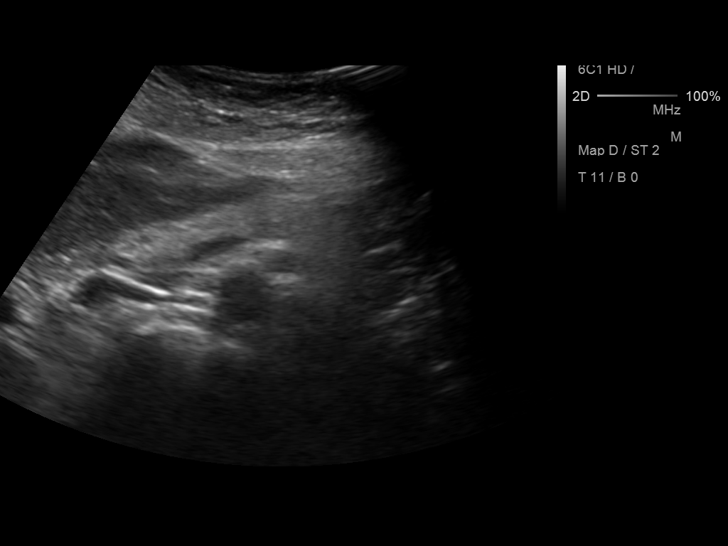
[im 7/77]
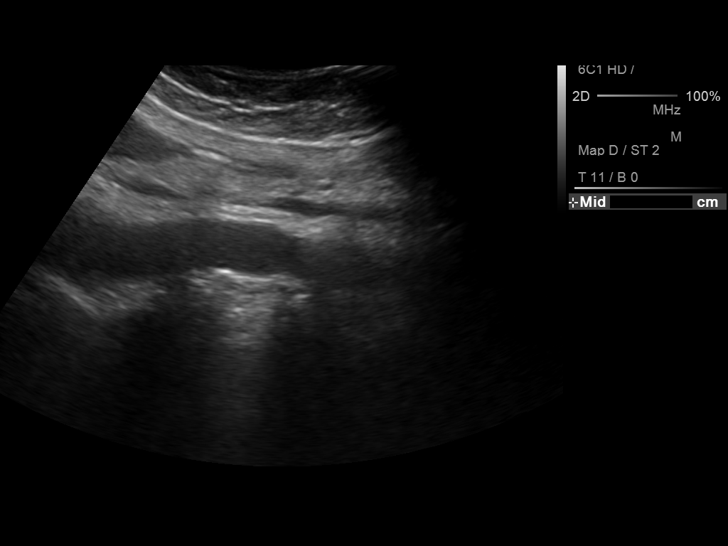
[im 13/77]
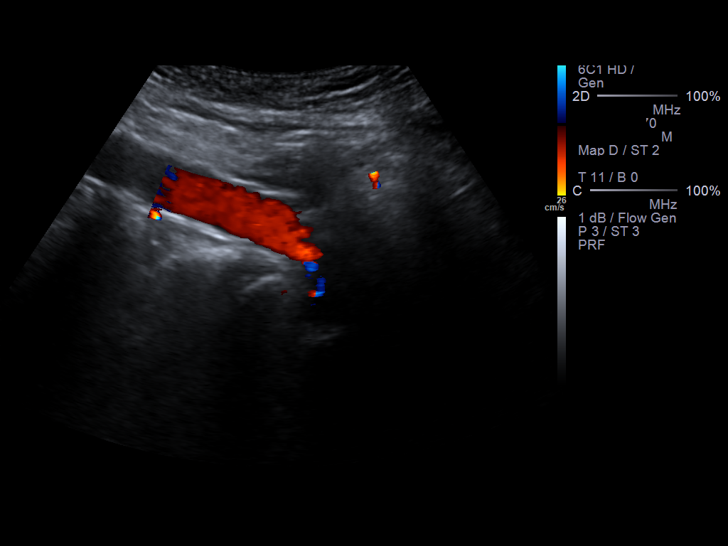
[im 20/77]
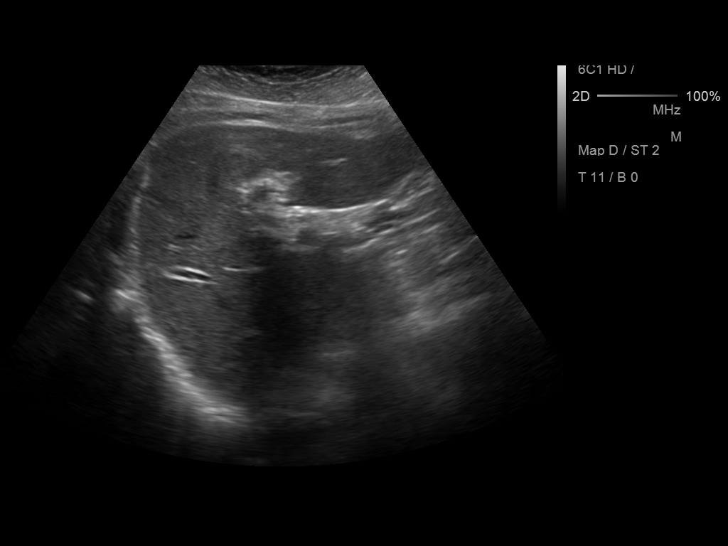
[im 26/77]
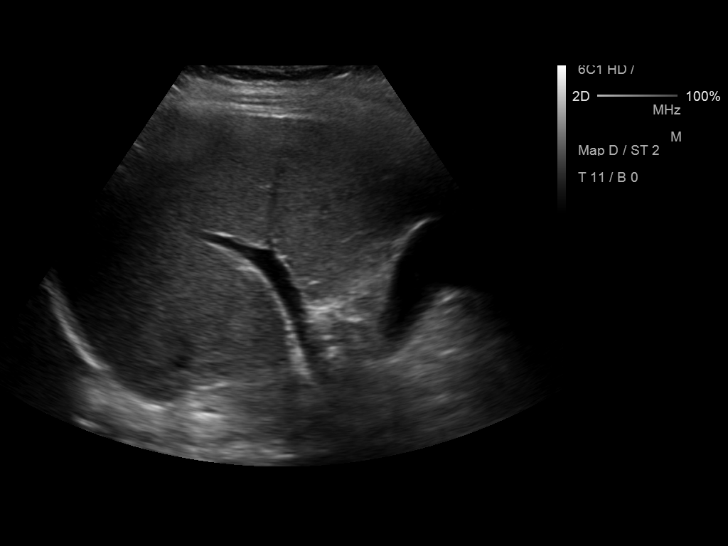
[im 32/77]
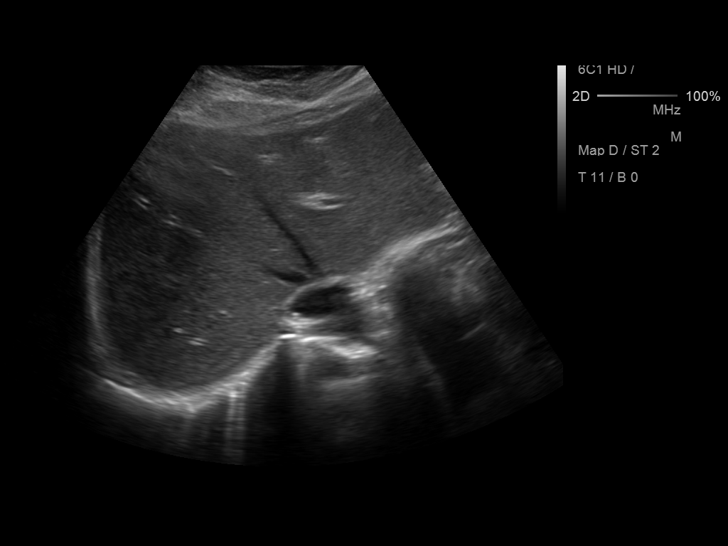
[im 39/77]
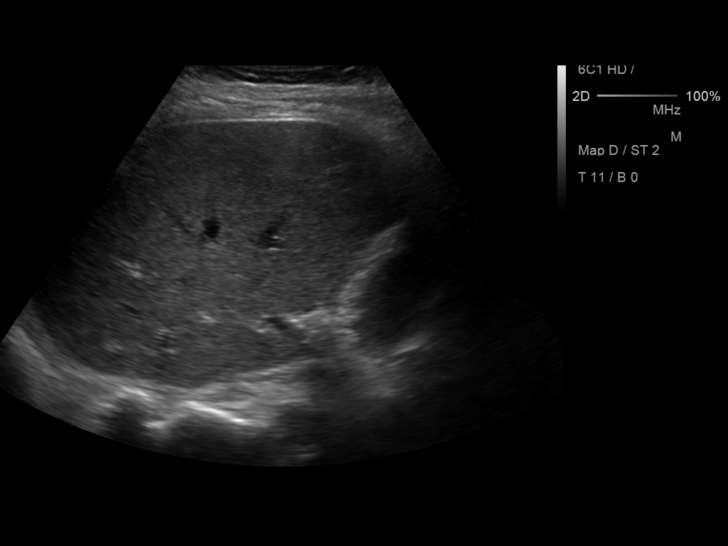
[im 45/77]
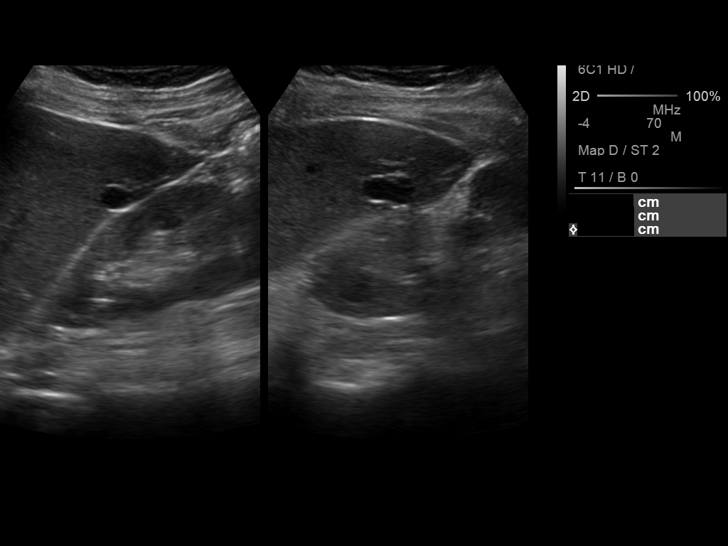
[im 51/77]
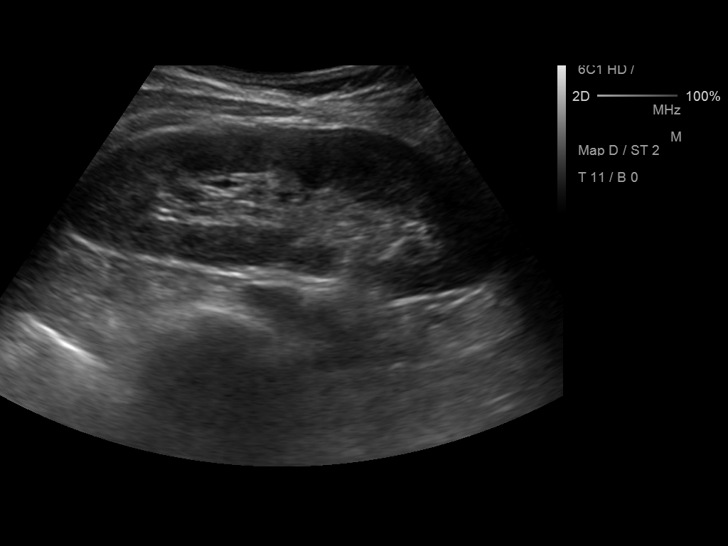
[im 58/77]
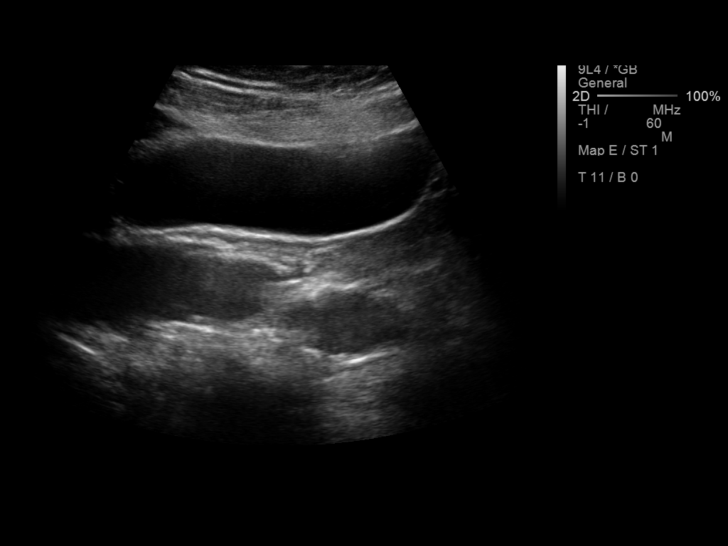
[im 64/77]
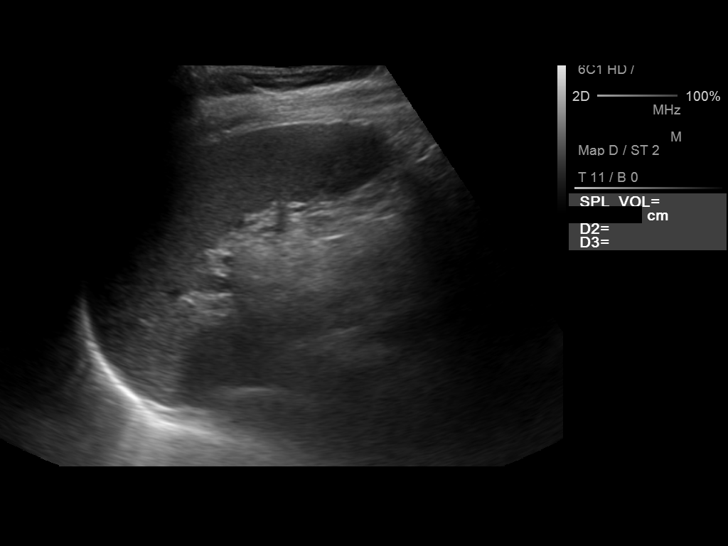
[im 70/77]
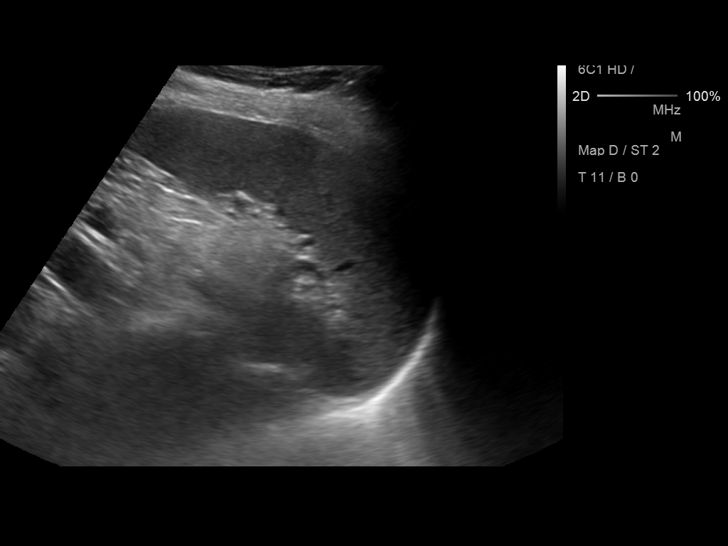
[im 77/77]
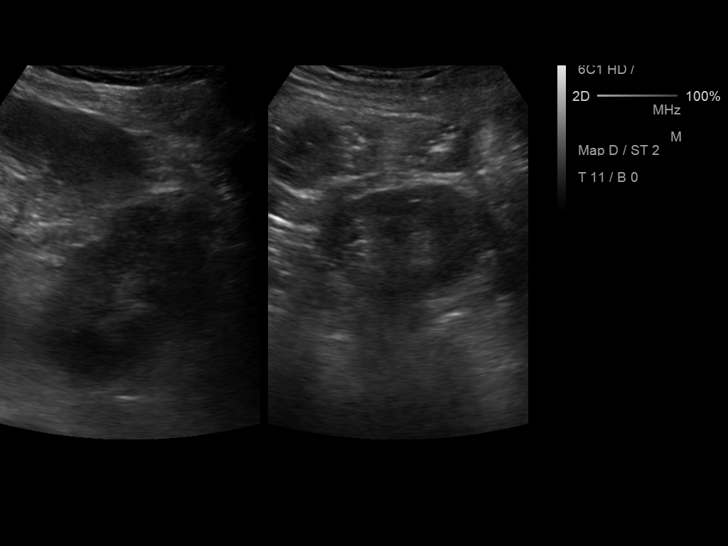

[13 of 25 positions shown; findings below may reference images not displayed]

FINDINGS: Gallbladder: No gallstones or wall thickening visualized. No
sonographic Murphy sign noted.

Common bile duct: Diameter: 2.7 mm in diameter in the porta hepatis.

Liver: In the left lobe of the liver there is a 1.2 x 1.0 x 2.0 cm
anechoic lesion with increased through transmission, compatible with
a small cyst. No other suspicious lesions are noted. No intrahepatic
biliary ductal dilatation. Within normal limits in parenchymal
echogenicity.

IVC: No abnormality visualized.

Pancreas: Visualized portion unremarkable.

Spleen: Size and appearance within normal limits.  8.8 cm in length.

Right Kidney: Length: 11.1 cm. Echogenicity within normal limits. No
mass or hydronephrosis visualized.

Left Kidney: Length: 11.8 cm. Echogenicity within normal limits. No
mass or hydronephrosis visualized.

Abdominal aorta: No aneurysm visualized.

Other findings: None.
IMPRESSION: 1. No findings to account for the patient's elevated liver function
tests. Specifically, no intra or extrahepatic biliary ductal
dilatation.
2. 1.2 x 1.0 x 2.0 cm cyst in the left lobe of the liver
incidentally noted.

## 2016-01-26 ENCOUNTER — Encounter: Payer: Medicare Other | Admitting: Family Medicine

## 2016-03-13 ENCOUNTER — Ambulatory Visit (INDEPENDENT_AMBULATORY_CARE_PROVIDER_SITE_OTHER): Payer: Medicare Other | Admitting: Physician Assistant

## 2016-03-13 ENCOUNTER — Encounter: Payer: Self-pay | Admitting: Physician Assistant

## 2016-03-13 VITALS — BP 112/70 | HR 60 | Temp 97.5°F | Resp 16 | Ht 64.0 in | Wt 137.6 lb

## 2016-03-13 DIAGNOSIS — E7800 Pure hypercholesterolemia, unspecified: Secondary | ICD-10-CM

## 2016-03-13 DIAGNOSIS — R7989 Other specified abnormal findings of blood chemistry: Secondary | ICD-10-CM | POA: Diagnosis not present

## 2016-03-13 DIAGNOSIS — D72820 Lymphocytosis (symptomatic): Secondary | ICD-10-CM

## 2016-03-13 DIAGNOSIS — Z0000 Encounter for general adult medical examination without abnormal findings: Secondary | ICD-10-CM | POA: Diagnosis not present

## 2016-03-13 DIAGNOSIS — M810 Age-related osteoporosis without current pathological fracture: Secondary | ICD-10-CM | POA: Diagnosis not present

## 2016-03-13 DIAGNOSIS — R739 Hyperglycemia, unspecified: Secondary | ICD-10-CM | POA: Diagnosis not present

## 2016-03-13 DIAGNOSIS — E559 Vitamin D deficiency, unspecified: Secondary | ICD-10-CM

## 2016-03-13 DIAGNOSIS — R945 Abnormal results of liver function studies: Secondary | ICD-10-CM

## 2016-03-13 NOTE — Progress Notes (Signed)
Patient: Natalie Pena, Female    DOB: Apr 21, 1941, 75 y.o.   MRN: [id number] Visit Date: 03/13/2016  Today's Provider: Margaretann Loveless, PA-C   Chief Complaint  Patient presents with  . Medicare Wellness   Subjective:    Annual wellness visit Natalie Pena is a 75 y.o. female. She feels well. She reports exercising none, but states keeps self very busy. She reports she is sleeping fairly well.  Last PCP:01/25/15 BMD: 11/13/11-Osteoporosis Mammo: 03/15/15- Normal Category 1: Negative Colonoscopy: 05/05/07-Repeat 2018 [place]: 2008 (in [place]) -----------------------------------------------------------   Review of Systems  Constitutional: Negative.   HENT: Negative.   Eyes: Negative.   Respiratory: Negative.   Cardiovascular: Negative.   Gastrointestinal: Negative.   Endocrine: Negative.   Genitourinary: Negative.   Musculoskeletal: Negative.   Skin: Negative.   Allergic/Immunologic: Negative.   Neurological: Negative.   Hematological: Negative.   Psychiatric/Behavioral: Negative.     Social History   Social History  . Marital status: Widowed    Spouse name: N/A  . Number of children: 2  . Years of education: N/A   Occupational History  . retired    Social History Main Topics  . Smoking status: Never Smoker  . Smokeless tobacco: Never Used     Comment: tobacco use - no  . Alcohol use No  . Drug use: No  . Sexual activity: Not on file   Other Topics Concern  . Not on file   Social History Narrative   Widowed, retired, gets regular exercise.     Past Medical History:  Diagnosis Date  . Hypercholesterolemia   . Osteoporosis      Patient Active Problem List   Diagnosis Date Noted  . Abnormal LFTs 11/23/2014  . Gravida 0 11/23/2014  . Difficulty hearing 11/23/2014  . Blood glucose elevated 11/23/2014  . Elevated lymphocyte count 11/23/2014  . Head revolving around 11/23/2014  . Avitaminosis D 11/23/2014  . Routine general  medical examination at a health care facility 07/27/2009  . OP (osteoporosis) 06/15/2008  . Hypercholesteremia 09/21/2007  . Family history of cardiovascular disease 03/24/2007  . Atonic constipation 03/24/2007    Past Surgical History:  Procedure Laterality Date  . APPENDECTOMY    . CESAREAN SECTION  1973  . EYE SURGERY Left 05/2014   cataract surgery  . TONSILLECTOMY AND ADENOIDECTOMY  1940  . TOTAL ABDOMINAL HYSTERECTOMY  1974   fibroids  . unilateral oophorectomy  1974   left    Her family history includes Breast cancer (age of onset: 25) in her paternal grandmother.    Current Meds  Medication Sig  . aspirin 81 MG EC tablet Take 81 mg by mouth daily.    . cholecalciferol (VITAMIN D) 1000 units tablet Take 1,000 Units by mouth daily.  Marland Kitchen FLAXSEED, LINSEED, PO Take by mouth.  Marland Kitchen ibuprofen (ADVIL,MOTRIN) 200 MG tablet Take 200 mg by mouth every 6 (six) hours as needed.    . Multiple Vitamin (MULTIVITAMIN) tablet Take 1 tablet by mouth daily.      Patient Care Team: Lorie Phenix, MD as PCP - General (Family Medicine)    Objective:   Vitals: BP 112/70 (BP Location: Right Arm, Patient Position: Sitting, Cuff Size: Normal)   Pulse 60   Temp 97.5 F (36.4 C) (Oral)   Resp 16   Ht 5\' 4"  (1.626 m)   Wt 137 lb 9.6 oz (62.4 kg)   BMI 23.62 kg/m   Physical Exam  Constitutional:  She is oriented to person, place, and time. She appears well-developed and well-nourished. No distress.  HENT:  Head: Normocephalic and atraumatic.  Right Ear: Tympanic membrane, external ear and ear canal normal.  Left Ear: Tympanic membrane, external ear and ear canal normal.  Nose: Nose normal.  Mouth/Throat: Uvula is midline, oropharynx is clear and moist and mucous membranes are normal. No oropharyngeal exudate.  Eyes: Conjunctivae and EOM are normal. Pupils are equal, round, and reactive to light. Right eye exhibits no discharge. Left eye exhibits no discharge. No scleral icterus.  Neck:  Normal range of motion. Neck supple. No JVD present. Carotid bruit is not present. No tracheal deviation present. No thyromegaly present.  Cardiovascular: Normal rate, regular rhythm, normal heart sounds and intact distal pulses.  Exam reveals no gallop and no friction rub.   No murmur heard. Pulmonary/Chest: Effort normal and breath sounds normal. No respiratory distress. She has no wheezes. She has no rales. She exhibits no tenderness.  Abdominal: Soft. Bowel sounds are normal. She exhibits no distension and no mass. There is no tenderness. There is no rebound and no guarding.  Musculoskeletal: Normal range of motion. She exhibits no edema or tenderness.  Lymphadenopathy:    She has no cervical adenopathy.  Neurological: She is alert and oriented to person, place, and time.  Skin: Skin is warm and dry. No rash noted. She is not diaphoretic.  Psychiatric: She has a normal mood and affect. Her behavior is normal. Judgment and thought content normal.  Vitals reviewed.   Activities of Daily Living In your present state of health, do you have any difficulty performing the following activities: 03/13/2016  Hearing? N  Vision? N  Difficulty concentrating or making decisions? N  Walking or climbing stairs? N  Dressing or bathing? N  Doing errands, shopping? N  Some recent data might be hidden    Fall Risk Assessment Fall Risk  03/13/2016 01/25/2015  Falls in the past year? Yes No  Number falls in past yr: 1 -  Injury with Fall? No -     Depression Screen PHQ 2/9 Scores 03/13/2016 01/25/2015  PHQ - 2 Score 0 0    Cognitive Testing - 6-CIT  Correct? Score   What year is it? yes 0 0 or 4  What month is it? yes 0 0 or 3  Memorize:    Floyde ParkinsJohn,  [place],  40,  [address],  [place],      What time is it? (within 1 hour) yes 0 0 or 3  Count backwards from 20 yes 0 0, 2, or 4  Name the months of the year yes 0 0, 2, or 4  Repeat name & address above no 6 0, 2, 4, 6, 8, or 10       TOTAL SCORE   6/28   Interpretation:  Normal  Normal (0-7) Abnormal (8-28)   Audit-C Alcohol Use Screening  Question Answer Points  How often do you have alcoholic drink? never 0  On days you do drink alcohol, how many drinks do you typically consume? 0 0  How oftey will you drink 6 or more in a total? never 0  Total Score:  0   A score of 3 or more in women, and 4 or more in men indicates increased risk for alcohol abuse, EXCEPT if all of the points are from question 1.  Visual Acuity Screening   Right eye Left eye Both eyes  Without correction:     With  correction: 20/20 20/20 20/20   Comments: Is scheduled for Sept. 7,2017     Assessment & Plan:     Annual Wellness Visit  Reviewed patient's Family Medical History Reviewed and updated list of patient's medical providers Assessment of cognitive impairment was done Assessed patient's functional ability Established a written schedule for health screening services Health Risk Assessent Completed and Reviewed  Exercise Activities and Dietary recommendations Goals    . Exercise 150 minutes per week (moderate activity)       Immunization History  Administered Date(s) Administered  . Pneumococcal Conjugate-13 12/29/2013  . Pneumococcal Polysaccharide-23 07/22/2005  . Tdap 08/09/2010    Health Maintenance  Topic Date Due  . ZOSTAVAX  02/04/1999  . INFLUENZA VACCINE  02/20/2016  . TETANUS/TDAP  08/09/2020  . DEXA SCAN  Addressed  . PNA vac Low Risk Adult  Completed      Discussed health benefits of physical activity, and encouraged her to engage in regular exercise appropriate for her age and condition.   1. Medicare annual wellness visit, subsequent Normal physical exam.  2. Abnormal LFTs Will check labs as below and f/u pending results. - Comprehensive metabolic panel  3. Hypercholesteremia Will check labs as below and f/u pending results. - Lipid panel  4. Blood glucose elevated Will check labs as below and f/u  pending results. - Hemoglobin A1c  5. Elevated lymphocyte count Will check labs as below and f/u pending results. - CBC with Differential/Platelet  6. Avitaminosis D Will check labs as below and f/u pending results. - Vitamin D (25 hydroxy)  7. OP (osteoporosis) Will check labs as below and f/u pending results. - TSH - Vitamin D (25 hydroxy)  ------------------------------------------------------------------------------------------------------------    Margaretann LovelessJennifer M Soleil Mas, PA-C  [facility]

## 2016-03-13 NOTE — Patient Instructions (Signed)

## 2016-03-14 ENCOUNTER — Telehealth: Payer: Self-pay

## 2016-03-14 LAB — LIPID PANEL
Chol/HDL Ratio: 2.2 ratio (ref 0.0–4.4)
Cholesterol, Total: 258 mg/dL — ABNORMAL HIGH (ref 100–199)
HDL: 116 mg/dL
LDL Calculated: 125 mg/dL — ABNORMAL HIGH (ref 0–99)
Triglycerides: 87 mg/dL (ref 0–149)
VLDL Cholesterol Cal: 17 mg/dL (ref 5–40)

## 2016-03-14 LAB — CBC WITH DIFFERENTIAL/PLATELET
Basophils Absolute: 0 x10E3/uL (ref 0.0–0.2)
Basos: 1 %
EOS (ABSOLUTE): 0.1 x10E3/uL (ref 0.0–0.4)
Eos: 2 %
Hematocrit: 43.7 % (ref 34.0–46.6)
Hemoglobin: 15 g/dL (ref 11.1–15.9)
Immature Grans (Abs): 0 x10E3/uL (ref 0.0–0.1)
Immature Granulocytes: 0 %
Lymphocytes Absolute: 1.9 x10E3/uL (ref 0.7–3.1)
Lymphs: 32 %
MCH: 29.6 pg (ref 26.6–33.0)
MCHC: 34.3 g/dL (ref 31.5–35.7)
MCV: 86 fL (ref 79–97)
Monocytes Absolute: 0.5 x10E3/uL (ref 0.1–0.9)
Monocytes: 9 %
Neutrophils Absolute: 3.3 x10E3/uL (ref 1.4–7.0)
Neutrophils: 56 %
Platelets: 264 x10E3/uL (ref 150–379)
RBC: 5.07 x10E6/uL (ref 3.77–5.28)
RDW: 14.5 % (ref 12.3–15.4)
WBC: 5.8 x10E3/uL (ref 3.4–10.8)

## 2016-03-14 LAB — HEMOGLOBIN A1C
Est. average glucose Bld gHb Est-mCnc: 120 mg/dL
Hgb A1c MFr Bld: 5.8 % — ABNORMAL HIGH (ref 4.8–5.6)

## 2016-03-14 LAB — VITAMIN D 25 HYDROXY (VIT D DEFICIENCY, FRACTURES): VIT D 25 HYDROXY: 38.2 ng/mL (ref 30.0–100.0)

## 2016-03-14 LAB — COMPREHENSIVE METABOLIC PANEL WITH GFR
ALT: 13 IU/L (ref 0–32)
AST: 19 IU/L (ref 0–40)
Albumin/Globulin Ratio: 2 (ref 1.2–2.2)
Albumin: 4.7 g/dL (ref 3.5–4.8)
Alkaline Phosphatase: 55 IU/L (ref 39–117)
BUN/Creatinine Ratio: 18 (ref 12–28)
BUN: 10 mg/dL (ref 8–27)
Bilirubin Total: 0.4 mg/dL (ref 0.0–1.2)
CO2: 24 mmol/L (ref 18–29)
Calcium: 9.7 mg/dL (ref 8.7–10.3)
Chloride: 99 mmol/L (ref 96–106)
Creatinine, Ser: 0.57 mg/dL (ref 0.57–1.00)
GFR calc Af Amer: 103 mL/min/1.73
GFR calc non Af Amer: 90 mL/min/1.73
Globulin, Total: 2.3 g/dL (ref 1.5–4.5)
Glucose: 100 mg/dL — ABNORMAL HIGH (ref 65–99)
Potassium: 4.5 mmol/L (ref 3.5–5.2)
Sodium: 142 mmol/L (ref 134–144)
Total Protein: 7 g/dL (ref 6.0–8.5)

## 2016-03-14 LAB — TSH: TSH: 2.39 u[IU]/mL (ref 0.450–4.500)

## 2016-03-14 NOTE — Telephone Encounter (Signed)
-----   Message from Margaretann LovelessJennifer M Burnette, [place]PA-C sent at 03/14/2016 12:31 PM EDT ----- All labs are within normal limits and stable.  HgBA1c and cholesterol have improved from labs last year. Continue lifestyle modifications. Thanks! -JB

## 2016-03-14 NOTE — Telephone Encounter (Signed)
LMTCB

## 2016-03-14 NOTE — Telephone Encounter (Signed)
Patient advised.

## 2017-03-12 ENCOUNTER — Ambulatory Visit (INDEPENDENT_AMBULATORY_CARE_PROVIDER_SITE_OTHER): Payer: Medicare Other

## 2017-03-12 VITALS — BP 140/84 | HR 64 | Temp 99.1°F | Ht 64.0 in | Wt 141.2 lb

## 2017-03-12 DIAGNOSIS — Z0000 Encounter for general adult medical examination without abnormal findings: Secondary | ICD-10-CM

## 2017-03-12 NOTE — Progress Notes (Signed)
Subjective:   Natalie Pena is a 76 y.o. female who presents for Medicare Annual (Subsequent) preventive examination.  Review of Systems:  N/A  Cardiac Risk Factors include: advanced age (>57men, >68 women);dyslipidemia     Objective:     Vitals: BP 140/84 (BP Location: Left Arm)   Pulse 64   Temp 99.1 F (37.3 C) (Oral)   Ht 5\' 4"  (1.626 m)   Wt 141 lb 3.2 oz (64 kg)   BMI 24.24 kg/m   Body mass index is 24.24 kg/m.   Tobacco History  Smoking Status  . Never Smoker  Smokeless Tobacco  . Never Used    Comment: tobacco use - no     Counseling given: Not Answered   Past Medical History:  Diagnosis Date  . Hypercholesterolemia   . Osteoporosis    Past Surgical History:  Procedure Laterality Date  . APPENDECTOMY    . CESAREAN SECTION  1973  . EYE SURGERY Left 05/2014   cataract surgery  . TONSILLECTOMY AND ADENOIDECTOMY  1940  . TOTAL ABDOMINAL HYSTERECTOMY  1974   fibroids  . unilateral oophorectomy  1974   left   Family History  Problem Relation Age of Onset  . Breast cancer Paternal Grandmother 8   History  Sexual Activity  . Sexual activity: Not on file    Outpatient Encounter Prescriptions as of 03/12/2017  Medication Sig  . aspirin 81 MG EC tablet Take 81 mg by mouth.   . cholecalciferol (VITAMIN D) 1000 units tablet Take 1,000 Units by mouth daily.  . cyanocobalamin 1000 MCG tablet Take 1,000 mcg by mouth daily.  Marland Kitchen FLAXSEED, LINSEED, PO Take 1,000 mg by mouth daily.   Marland Kitchen ibuprofen (ADVIL,MOTRIN) 200 MG tablet Take 200 mg by mouth every 6 (six) hours as needed.    . loratadine (CLARITIN) 10 MG tablet Take 10 mg by mouth daily as needed for allergies.  . Magnesium 250 MG TABS Take 250 mg by mouth.  . Multiple Vitamin (MULTIVITAMIN) tablet Take 1 tablet by mouth daily.    . [DISCONTINUED] meclizine (ANTIVERT) 25 MG tablet Take by mouth.   No facility-administered encounter medications on file as of 03/12/2017.     Activities of Daily  Living In your present state of health, do you have any difficulty performing the following activities: 03/12/2017 03/13/2016  Hearing? N N  Vision? N N  Difficulty concentrating or making decisions? N N  Walking or climbing stairs? N N  Dressing or bathing? N N  Doing errands, shopping? N N  Preparing Food and eating ? N -  Using the Toilet? N -  In the past six months, have you accidently leaked urine? Y -  Comment when coughs, wears protection -  Do you have problems with loss of bowel control? N -  Managing your Medications? N -  Managing your Finances? N -  Housekeeping or managing your Housekeeping? N -  Some recent data might be hidden    Patient Care Team: Lorie Phenix, MD as PCP - General (Family Medicine)    Assessment:     Exercise Activities and Dietary recommendations Current Exercise Habits: The patient does not participate in regular exercise at present (yard work), Exercise limited by: None identified  Goals    . Exercise 150 minutes per week (moderate activity)      Fall Risk Fall Risk  03/12/2017 03/13/2016 01/25/2015  Falls in the past year? No Yes No  Number falls in past yr: -  1 -  Injury with Fall? - No -   Depression Screen PHQ 2/9 Scores 03/12/2017 03/13/2016 01/25/2015  PHQ - 2 Score 0 0 0     Cognitive Function- Pt declined screening today.         Immunization History  Administered Date(s) Administered  . Pneumococcal Conjugate-13 12/29/2013  . Pneumococcal Polysaccharide-23 07/22/2005  . Tdap 08/09/2010   Screening Tests Health Maintenance  Topic Date Due  . INFLUENZA VACCINE  02/19/2017  . TETANUS/TDAP  08/09/2020  . DEXA SCAN  Completed  . PNA vac Low Risk Adult  Completed      Plan:  I have personally reviewed and addressed the Medicare Annual Wellness questionnaire and have noted the following in the patient's chart:  A. Medical and social history B. Use of alcohol, tobacco or illicit drugs  C. Current medications and  supplements D. Functional ability and status E.  Nutritional status F.  Physical activity G. Advance directives H. List of other physicians I.  Hospitalizations, surgeries, and ER visits in previous 12 months J.  Vitals K. Screenings such as hearing and vision if needed, cognitive and depression L. Referrals and appointments - none  In addition, I have reviewed and discussed with patient certain preventive protocols, quality metrics, and best practice recommendations. A written personalized care plan for preventive services as well as general preventive health recommendations were provided to patient.  See attached scanned questionnaire for additional information.   Signed,  Hyacinth Meeker, LPN Nurse Health Advisor   MD Recommendations: None.

## 2017-03-12 NOTE — Patient Instructions (Signed)
Natalie Pena , Thank you for taking time to come for your Medicare Wellness Visit. I appreciate your ongoing commitment to your health goals. Please review the following plan we discussed and let me know if I can assist you in the future.   Screening recommendations/referrals: Colonoscopy: no longer required Mammogram: no longer required Bone Density: completed Recommended yearly ophthalmology/optometry visit for glaucoma screening and checkup Recommended yearly dental visit for hygiene and checkup  Vaccinations: Influenza vaccine: declined Pneumococcal vaccine: completed series Tdap vaccine: up to date, due 07/2020 Shingles vaccine: completed per pt  Advanced directives: Advance directive discussed with you today. Even though you declined this today please call our office should you change your mind and we can give you the proper paperwork for you to fill out.  Conditions/risks identified: Recommend exercising (walking) three times a week for 20 minutes.   Next appointment: 03/17/17 @ 9:00 AM   Preventive Care 65 Years and Older, Female Preventive care refers to lifestyle choices and visits with your health care provider that can promote health and wellness. What does preventive care include?  A yearly physical exam. This is also called an annual well check.  Dental exams once or twice a year.  Routine eye exams. Ask your health care provider how often you should have your eyes checked.  Personal lifestyle choices, including:  Daily care of your teeth and gums.  Regular physical activity.  Eating a healthy diet.  Avoiding tobacco and drug use.  Limiting alcohol use.  Practicing safe sex.  Taking low-dose aspirin every day.  Taking vitamin and mineral supplements as recommended by your health care provider. What happens during an annual well check? The services and screenings done by your health care provider during your annual well check will depend on your age,  overall health, lifestyle risk factors, and family history of disease. Counseling  Your health care provider may ask you questions about your:  Alcohol use.  Tobacco use.  Drug use.  Emotional well-being.  Home and relationship well-being.  Sexual activity.  Eating habits.  History of falls.  Memory and ability to understand (cognition).  Work and work Astronomer.  Reproductive health. Screening  You may have the following tests or measurements:  Height, weight, and BMI.  Blood pressure.  Lipid and cholesterol levels. These may be checked every 5 years, or more frequently if you are over 75 years old.  Skin check.  Lung cancer screening. You may have this screening every year starting at age 84 if you have a 30-pack-year history of smoking and currently smoke or have quit within the past 15 years.  Fecal occult blood test (FOBT) of the stool. You may have this test every year starting at age 69.  Flexible sigmoidoscopy or colonoscopy. You may have a sigmoidoscopy every 5 years or a colonoscopy every 10 years starting at age 29.  Hepatitis C blood test.  Hepatitis B blood test.  Sexually transmitted disease (STD) testing.  Diabetes screening. This is done by checking your blood sugar (glucose) after you have not eaten for a while (fasting). You may have this done every 1-3 years.  Bone density scan. This is done to screen for osteoporosis. You may have this done starting at age 52.  Mammogram. This may be done every 1-2 years. Talk to your health care provider about how often you should have regular mammograms. Talk with your health care provider about your test results, treatment options, and if necessary, the need for more tests.  Vaccines  Your health care provider may recommend certain vaccines, such as:  Influenza vaccine. This is recommended every year.  Tetanus, diphtheria, and acellular pertussis (Tdap, Td) vaccine. You may need a Td booster every 10  years.  Zoster vaccine. You may need this after age 11.  Pneumococcal 13-valent conjugate (PCV13) vaccine. One dose is recommended after age 54.  Pneumococcal polysaccharide (PPSV23) vaccine. One dose is recommended after age 33. Talk to your health care provider about which screenings and vaccines you need and how often you need them. This information is not intended to replace advice given to you by your health care provider. Make sure you discuss any questions you have with your health care provider. Document Released: 08/04/2015 Document Revised: 03/27/2016 Document Reviewed: 05/09/2015 Elsevier Interactive Patient Education  2017 [employer] Prevention in the Home Falls can cause injuries. They can happen to people of all ages. There are many things you can do to make your home safe and to help prevent falls. What can I do on the outside of my home?  Regularly fix the edges of walkways and driveways and fix any cracks.  Remove anything that might make you trip as you walk through a door, such as a raised step or threshold.  Trim any bushes or trees on the path to your home.  Use bright outdoor lighting.  Clear any walking paths of anything that might make someone trip, such as rocks or tools.  Regularly check to see if handrails are loose or broken. Make sure that both sides of any steps have handrails.  Any raised decks and porches should have guardrails on the edges.  Have any leaves, snow, or ice cleared regularly.  Use sand or salt on walking paths during winter.  Clean up any spills in your garage right away. This includes oil or grease spills. What can I do in the bathroom?  Use night lights.  Install grab bars by the toilet and in the tub and shower. Do not use towel bars as grab bars.  Use non-skid mats or decals in the tub or shower.  If you need to sit down in the shower, use a plastic, non-slip stool.  Keep the floor dry. Clean up any water that  spills on the floor as soon as it happens.  Remove soap buildup in the tub or shower regularly.  Attach bath mats securely with double-sided non-slip rug tape.  Do not have throw rugs and other things on the floor that can make you trip. What can I do in the bedroom?  Use night lights.  Make sure that you have a light by your bed that is easy to reach.  Do not use any sheets or blankets that are too big for your bed. They should not hang down onto the floor.  Have a firm chair that has side arms. You can use this for support while you get dressed.  Do not have throw rugs and other things on the floor that can make you trip. What can I do in the kitchen?  Clean up any spills right away.  Avoid walking on wet floors.  Keep items that you use a lot in easy-to-reach places.  If you need to reach something above you, use a strong step stool that has a grab bar.  Keep electrical cords out of the way.  Do not use floor polish or wax that makes floors slippery. If you must use wax, use non-skid floor wax.  Do not have throw rugs and other things on the floor that can make you trip. What can I do with my stairs?  Do not leave any items on the stairs.  Make sure that there are handrails on both sides of the stairs and use them. Fix handrails that are broken or loose. Make sure that handrails are as long as the stairways.  Check any carpeting to make sure that it is firmly attached to the stairs. Fix any carpet that is loose or worn.  Avoid having throw rugs at the top or bottom of the stairs. If you do have throw rugs, attach them to the floor with carpet tape.  Make sure that you have a light switch at the top of the stairs and the bottom of the stairs. If you do not have them, ask someone to add them for you. What else can I do to help prevent falls?  Wear shoes that:  Do not have high heels.  Have rubber bottoms.  Are comfortable and fit you well.  Are closed at the  toe. Do not wear sandals.  If you use a stepladder:  Make sure that it is fully opened. Do not climb a closed stepladder.  Make sure that both sides of the stepladder are locked into place.  Ask someone to hold it for you, if possible.  Clearly mark and make sure that you can see:  Any grab bars or handrails.  First and last steps.  Where the edge of each step is.  Use tools that help you move around (mobility aids) if they are needed. These include:  Canes.  Walkers.  Scooters.  Crutches.  Turn on the lights when you go into a dark area. Replace any light bulbs as soon as they burn out.  Set up your furniture so you have a clear path. Avoid moving your furniture around.  If any of your floors are uneven, fix them.  If there are any pets around you, be aware of where they are.  Review your medicines with your doctor. Some medicines can make you feel dizzy. This can increase your chance of falling. Ask your doctor what other things that you can do to help prevent falls. This information is not intended to replace advice given to you by your health care provider. Make sure you discuss any questions you have with your health care provider. Document Released: 05/04/2009 Document Revised: 12/14/2015 Document Reviewed: 08/12/2014 Elsevier Interactive Patient Education  2017 [employer].

## 2017-03-17 ENCOUNTER — Ambulatory Visit (INDEPENDENT_AMBULATORY_CARE_PROVIDER_SITE_OTHER): Payer: Medicare Other | Admitting: Family Medicine

## 2017-03-17 ENCOUNTER — Encounter: Payer: Self-pay | Admitting: Family Medicine

## 2017-03-17 VITALS — BP 136/70 | HR 64 | Temp 98.4°F | Resp 16 | Ht 64.0 in | Wt 138.0 lb

## 2017-03-17 DIAGNOSIS — M810 Age-related osteoporosis without current pathological fracture: Secondary | ICD-10-CM

## 2017-03-17 DIAGNOSIS — R739 Hyperglycemia, unspecified: Secondary | ICD-10-CM

## 2017-03-17 DIAGNOSIS — R7303 Prediabetes: Secondary | ICD-10-CM

## 2017-03-17 DIAGNOSIS — Z1231 Encounter for screening mammogram for malignant neoplasm of breast: Secondary | ICD-10-CM | POA: Diagnosis not present

## 2017-03-17 DIAGNOSIS — Z0000 Encounter for general adult medical examination without abnormal findings: Secondary | ICD-10-CM

## 2017-03-17 DIAGNOSIS — E7800 Pure hypercholesterolemia, unspecified: Secondary | ICD-10-CM | POA: Diagnosis not present

## 2017-03-17 NOTE — Patient Instructions (Signed)
Preventive Care 65 Years and Older, Female Preventive care refers to lifestyle choices and visits with your health care provider that can promote health and wellness. What does preventive care include?  A yearly physical exam. This is also called an annual well check.  Dental exams once or twice a year.  Routine eye exams. Ask your health care provider how often you should have your eyes checked.  Personal lifestyle choices, including: ? Daily care of your teeth and gums. ? Regular physical activity. ? Eating a healthy diet. ? Avoiding tobacco and drug use. ? Limiting alcohol use. ? Practicing safe sex. ? Taking low-dose aspirin every day. ? Taking vitamin and mineral supplements as recommended by your health care provider. What happens during an annual well check? The services and screenings done by your health care provider during your annual well check will depend on your age, overall health, lifestyle risk factors, and family history of disease. Counseling Your health care provider may ask you questions about your:  Alcohol use.  Tobacco use.  Drug use.  Emotional well-being.  Home and relationship well-being.  Sexual activity.  Eating habits.  History of falls.  Memory and ability to understand (cognition).  Work and work environment.  Reproductive health.  Screening You may have the following tests or measurements:  Height, weight, and BMI.  Blood pressure.  Lipid and cholesterol levels. These may be checked every 5 years, or more frequently if you are over 50 years old.  Skin check.  Lung cancer screening. You may have this screening every year starting at age 55 if you have a 30-pack-year history of smoking and currently smoke or have quit within the past 15 years.  Fecal occult blood test (FOBT) of the stool. You may have this test every year starting at age 50.  Flexible sigmoidoscopy or colonoscopy. You may have a sigmoidoscopy every 5 years or  a colonoscopy every 10 years starting at age 50.  Hepatitis C blood test.  Hepatitis B blood test.  Sexually transmitted disease (STD) testing.  Diabetes screening. This is done by checking your blood sugar (glucose) after you have not eaten for a while (fasting). You may have this done every 1-3 years.  Bone density scan. This is done to screen for osteoporosis. You may have this done starting at age 65.  Mammogram. This may be done every 1-2 years. Talk to your health care provider about how often you should have regular mammograms.  Talk with your health care provider about your test results, treatment options, and if necessary, the need for more tests. Vaccines Your health care provider may recommend certain vaccines, such as:  Influenza vaccine. This is recommended every year.  Tetanus, diphtheria, and acellular pertussis (Tdap, Td) vaccine. You may need a Td booster every 10 years.  Varicella vaccine. You may need this if you have not been vaccinated.  Zoster vaccine. You may need this after age 60.  Measles, mumps, and rubella (MMR) vaccine. You may need at least one dose of MMR if you were born in 1957 or later. You may also need a second dose.  Pneumococcal 13-valent conjugate (PCV13) vaccine. One dose is recommended after age 65.  Pneumococcal polysaccharide (PPSV23) vaccine. One dose is recommended after age 65.  Meningococcal vaccine. You may need this if you have certain conditions.  Hepatitis A vaccine. You may need this if you have certain conditions or if you travel or work in places where you may be exposed to hepatitis   A.  Hepatitis B vaccine. You may need this if you have certain conditions or if you travel or work in places where you may be exposed to hepatitis B.  Haemophilus influenzae type b (Hib) vaccine. You may need this if you have certain conditions.  Talk to your health care provider about which screenings and vaccines you need and how often you  need them. This information is not intended to replace advice given to you by your health care provider. Make sure you discuss any questions you have with your health care provider. Document Released: 08/04/2015 Document Revised: 03/27/2016 Document Reviewed: 05/09/2015 Elsevier Interactive Patient Education  2017 [employer].

## 2017-03-17 NOTE — Progress Notes (Signed)
Patient: Natalie Pena, Female    DOB: 26-Jun-1941, 76 y.o.   MRN: [id number] Visit Date: 03/17/2017  Today's Provider: Shirlee Latch, MD   Chief Complaint  Patient presents with  . Annual Exam   Subjective:    Annual physical exam Natalie Pena is a 76 y.o. female who presents today for health maintenance and complete physical. She feels well. She reports exercising some. Housework and yard work. She reports she is sleeping fairly well.  She saw the nurse health advisor for her annual wellness exam on 03/12/2017. Last mammogram- 03/15/2015- BI-RADS 1 Last BMD- 11/13/2011- osteoporosis - decided not to take treatment - does not want retesting Last colonoscopy- 05/05/2007- WNL. Recheck 10 years. -----------------------------------------------------------------   Review of Systems  Constitutional: Negative.   HENT: Negative.   Eyes: Negative.   Respiratory: Negative.   Cardiovascular: Negative.   Gastrointestinal: Negative.   Endocrine: Negative.   Genitourinary: Negative.   Musculoskeletal: Negative.   Skin: Negative.   Allergic/Immunologic: Positive for environmental allergies. Negative for food allergies and immunocompromised state.  Neurological: Negative.   Hematological: Negative.   Psychiatric/Behavioral: Negative.     Social History      She  reports that she has never smoked. She has never used smokeless tobacco. She reports that she does not drink alcohol or use drugs.       Social History   Social History  . Marital status: Widowed    Spouse name: N/A  . Number of children: 2  . 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 retired    Social History Main Topics  . Smoking status: Never Smoker  . Smokeless tobacco: Never Used     Comment: tobacco use - no  . Alcohol use No  . Drug use: No  . Sexual activity: No   Other Topics Concern  . None   Social History Narrative   Widowed, retired, gets regular  exercise.     Past Medical History:  Diagnosis Date  . Hypercholesterolemia   . Osteoporosis      Patient Active Problem List   Diagnosis Date Noted  . Abnormal LFTs 11/23/2014  . Gravida 0 11/23/2014  . Difficulty hearing 11/23/2014  . Blood glucose elevated 11/23/2014  . Elevated lymphocyte count 11/23/2014  . Head revolving around 11/23/2014  . Avitaminosis D 11/23/2014  . Routine general medical examination at a health care facility 07/27/2009  . OP (osteoporosis) 06/15/2008  . Hypercholesteremia 09/21/2007  . Family history of cardiovascular disease 03/24/2007  . Atonic constipation 03/24/2007    Past Surgical History:  Procedure Laterality Date  . APPENDECTOMY    . BILATERAL ANTERIOR TOTAL HIP ARTHROPLASTY    . CESAREAN SECTION  1973  . EYE SURGERY Left 05/2014   cataract surgery  . TONSILLECTOMY AND ADENOIDECTOMY  1940  . TOTAL ABDOMINAL HYSTERECTOMY  1974   fibroids  . unilateral oophorectomy  1974   left    Family History        Family Status  Relation Status  . Father Deceased       pancreatic cancer  . Mother Deceased       MI, CAd  . PGM (Not Specified)  . Sister Alive        Her family history includes Breast cancer (age of onset: 35) in her paternal grandmother; Heart attack in her mother; Pancreatic cancer in her father; Stroke in her sister.     Allergies  Allergen Reactions  . Shellfish Allergy      Current Outpatient Prescriptions:  .  aspirin 81 MG EC tablet, Take 81 mg by mouth. , Disp: , Rfl:  .  cholecalciferol (VITAMIN D) 1000 units tablet, Take 1,000 Units by mouth daily., Disp: , Rfl:  .  cyanocobalamin 1000 MCG tablet, Take 1,000 mcg by mouth daily., Disp: , Rfl:  .  FLAXSEED, LINSEED, PO, Take 1,000 mg by mouth daily. , Disp: , Rfl:  .  ibuprofen (ADVIL,MOTRIN) 200 MG tablet, Take 200 mg by mouth every 6 (six) hours as needed.  , Disp: , Rfl:  .  loratadine (CLARITIN) 10 MG tablet, Take 10 mg by mouth daily as needed for  allergies., Disp: , Rfl:  .  Magnesium 250 MG TABS, Take 250 mg by mouth., Disp: , Rfl:  .  Multiple Vitamin (MULTIVITAMIN) tablet, Take 1 tablet by mouth daily.  , Disp: , Rfl:    Patient Care Team: Erasmo Downer, MD as PCP - General (Family Medicine) Lockie Mola, MD as Referring Physician (Ophthalmology)      Objective:   Vitals: BP 136/70 (BP Location: Left Arm, Patient Position: Sitting, Cuff Size: Normal)   Pulse 64   Temp 98.4 F (36.9 C) (Oral)   Resp 16   Ht 5\' 4"  (1.626 m)   Wt 138 lb (62.6 kg)   BMI 23.69 kg/m    Vitals:   03/17/17 0905  BP: 136/70  Pulse: 64  Resp: 16  Temp: 98.4 F (36.9 C)  TempSrc: Oral  Weight: 138 lb (62.6 kg)  Height: 5\' 4"  (1.626 m)     Physical Exam  Constitutional: She is oriented to person, place, and time. She appears well-developed and well-nourished. No distress.  HENT:  Head: Normocephalic and atraumatic.  Right Ear: External ear normal.  Left Ear: External ear normal.  Nose: Nose normal.  Mouth/Throat: Oropharynx is clear and moist.  Eyes: Conjunctivae and EOM are normal. No scleral icterus.  Neck: Neck supple. No thyromegaly present.  Cardiovascular: Normal rate, regular rhythm, normal heart sounds and intact distal pulses.   No murmur heard. Pulmonary/Chest: Effort normal and breath sounds normal. No respiratory distress. She has no wheezes. She has no rales.  Abdominal: Soft. She exhibits no distension. There is no tenderness. There is no rebound and no guarding.  Musculoskeletal: She exhibits no edema or deformity.  Lymphadenopathy:    She has no cervical adenopathy.  Neurological: She is alert and oriented to person, place, and time. No cranial nerve deficit.  Skin: Skin is warm and dry. No rash noted.  Psychiatric: She has a normal mood and affect. Her behavior is normal.  Vitals reviewed.    Depression Screen PHQ 2/9 Scores 03/12/2017 03/13/2016 01/25/2015  PHQ - 2 Score 0 0 0      Assessment  & Plan:     Routine Health Maintenance and Physical Exam  Exercise Activities and Dietary recommendations Goals    . Exercise 150 minutes per week (moderate activity)    . Exercise 3x per week (20 min per time)          Recommend exercising (walking) three times a week for 20 minutes.        Immunization History  Administered Date(s) Administered  . Pneumococcal Conjugate-13 12/29/2013  . Pneumococcal Polysaccharide-23 07/22/2005  . Tdap 08/09/2010    Health Maintenance  Topic Date Due  . INFLUENZA VACCINE  02/19/2017  . TETANUS/TDAP  08/09/2020  . DEXA SCAN  Completed  . PNA vac Low Risk Adult  Completed     Discussed health benefits of physical activity, and encouraged her to engage in regular exercise appropriate for her age and condition.    --------------------------------------------------------------------  1. Healthcare maintenance - doing well, next CPE in 1 yr - recommend annual flu shot - will get at pharmacy next month - CBC w/Diff/Platelet  2. Encounter for screening mammogram for breast cancer - MM SCREENING BREAST TOMO BILATERAL  3. Hypercholesteremia - not on medication - Comprehensive metabolic panel - Lipid panel  4. Blood glucose elevated/Prediabetes - advised on low carb diet - Hemoglobin A1c  5. Osteoporosis without current pathological fracture, unspecified osteoporosis type - given that patient has decided that risks of treatment are not worth the benefit, joint decision was made to not recheck  The entirety of the information documented in the History of Present Illness, Review of Systems and Physical Exam were personally obtained by me. Portions of this information were initially documented by Irving Burton Ratchford, CMA and reviewed by me for thoroughness and accuracy.    Shirlee Latch, MD  [facility]

## 2017-03-18 LAB — COMPREHENSIVE METABOLIC PANEL
ALK PHOS: 57 IU/L (ref 39–117)
ALT: 15 IU/L (ref 0–32)
AST: 18 IU/L (ref 0–40)
Albumin/Globulin Ratio: 2 (ref 1.2–2.2)
Albumin: 4.5 g/dL (ref 3.5–4.8)
BILIRUBIN TOTAL: 0.4 mg/dL (ref 0.0–1.2)
BUN / CREAT RATIO: 22 (ref 12–28)
BUN: 14 mg/dL (ref 8–27)
CHLORIDE: 101 mmol/L (ref 96–106)
CO2: 25 mmol/L (ref 20–29)
Calcium: 9.7 mg/dL (ref 8.7–10.3)
Creatinine, Ser: 0.64 mg/dL (ref 0.57–1.00)
GFR calc Af Amer: 99 mL/min/{1.73_m2} (ref >59)
GFR calc non Af Amer: 86 mL/min/{1.73_m2} (ref >59)
GLOBULIN, TOTAL: 2.3 g/dL (ref 1.5–4.5)
GLUCOSE: 93 mg/dL (ref 65–99)
POTASSIUM: 4.5 mmol/L (ref 3.5–5.2)
SODIUM: 142 mmol/L (ref 134–144)
Total Protein: 6.8 g/dL (ref 6.0–8.5)

## 2017-03-18 LAB — CBC WITH DIFFERENTIAL/PLATELET
BASOS: 1 %
Basophils Absolute: 0.1 10*3/uL (ref 0.0–0.2)
EOS (ABSOLUTE): 0.2 10*3/uL (ref 0.0–0.4)
EOS: 3 %
Hematocrit: 42.5 % (ref 34.0–46.6)
Hemoglobin: 14.6 g/dL (ref 11.1–15.9)
Immature Grans (Abs): 0 10*3/uL (ref 0.0–0.1)
Immature Granulocytes: 0 %
LYMPHS: 39 %
Lymphocytes Absolute: 2.1 10*3/uL (ref 0.7–3.1)
MCH: 29.4 pg (ref 26.6–33.0)
MCHC: 34.4 g/dL (ref 31.5–35.7)
MCV: 86 fL (ref 79–97)
Monocytes Absolute: 0.5 10*3/uL (ref 0.1–0.9)
Monocytes: 9 %
NEUTROS PCT: 48 %
Neutrophils Absolute: 2.6 10*3/uL (ref 1.4–7.0)
PLATELETS: 265 10*3/uL (ref 150–379)
RBC: 4.97 x10E6/uL (ref 3.77–5.28)
RDW: 14.3 % (ref 12.3–15.4)
WBC: 5.5 10*3/uL (ref 3.4–10.8)

## 2017-03-18 LAB — LIPID PANEL
CHOLESTEROL TOTAL: 255 mg/dL — AB (ref 100–199)
Chol/HDL Ratio: 2.2 ratio (ref 0.0–4.4)
HDL: 115 mg/dL (ref >39)
LDL Calculated: 120 mg/dL — ABNORMAL HIGH (ref 0–99)
Triglycerides: 102 mg/dL (ref 0–149)
VLDL Cholesterol Cal: 20 mg/dL (ref 5–40)

## 2017-03-18 LAB — HEMOGLOBIN A1C
Est. average glucose Bld gHb Est-mCnc: 114 mg/dL
Hgb A1c MFr Bld: 5.6 % (ref 4.8–5.6)

## 2017-03-21 ENCOUNTER — Telehealth: Payer: Self-pay | Admitting: Family Medicine

## 2017-03-21 NOTE — Telephone Encounter (Signed)
Per Ms Perlie GoldRussell.  Dr. Earma ReadingBecigalupo said she would mail a copy of her labs.  I put a copy for in the mail for her.   Thanks,   -Vernona RiegerLaura

## 2017-03-21 NOTE — Telephone Encounter (Signed)
Pt called wanting a copy of her labs sent to in the mail.  No need to call her back  Thanks teri

## 2017-04-09 ENCOUNTER — Ambulatory Visit
Admission: RE | Admit: 2017-04-09 | Discharge: 2017-04-09 | Disposition: A | Discharge status: Home / Self Care | Payer: Medicare Other | Source: Ambulatory Visit | Attending: Family Medicine | Admitting: Family Medicine

## 2017-04-09 ENCOUNTER — Telehealth: Payer: Self-pay

## 2017-04-09 DIAGNOSIS — Z1231 Encounter for screening mammogram for malignant neoplasm of breast: Secondary | ICD-10-CM | POA: Diagnosis present

## 2017-04-09 NOTE — Telephone Encounter (Signed)
Patient advised as below.  

## 2017-04-09 NOTE — Telephone Encounter (Signed)
lmtcb

## 2017-04-09 NOTE — Telephone Encounter (Signed)
-----   Message from Erasmo Downer, MD sent at 04/09/2017 11:53 AM EDT ----- Normal mammogram  Bacigalupo, Marzella Schlein, MD, MPH [facility] 04/09/2017 11:53 AM

## 2017-12-09 DIAGNOSIS — H353131 Nonexudative age-related macular degeneration, bilateral, early dry stage: Secondary | ICD-10-CM | POA: Diagnosis not present

## 2017-12-09 DIAGNOSIS — H26492 Other secondary cataract, left eye: Secondary | ICD-10-CM | POA: Diagnosis not present

## 2018-01-05 DIAGNOSIS — G43109 Migraine with aura, not intractable, without status migrainosus: Secondary | ICD-10-CM | POA: Diagnosis not present

## 2018-02-12 ENCOUNTER — Telehealth: Payer: Self-pay

## 2018-02-12 NOTE — Telephone Encounter (Signed)
Called pt to see if could come in for her AWV on 03/13/18 @ 8:40 instead of 9. Pt did not answer, LMTCB. -MM

## 2018-02-17 NOTE — Telephone Encounter (Signed)
Pt moved apt to 8:40 AM  -MM

## 2018-03-13 ENCOUNTER — Ambulatory Visit (INDEPENDENT_AMBULATORY_CARE_PROVIDER_SITE_OTHER): Payer: Medicare Other

## 2018-03-13 VITALS — BP 126/56 | HR 77 | Temp 99.0°F | Ht 64.0 in | Wt 140.8 lb

## 2018-03-13 DIAGNOSIS — Z0000 Encounter for general adult medical examination without abnormal findings: Secondary | ICD-10-CM

## 2018-03-13 NOTE — Patient Instructions (Addendum)
[name] , Thank you for taking time to come for your Medicare Wellness Visit. I appreciate your ongoing commitment to your health goals. Please review the following plan we discussed and let me know if I can assist you in the future.   Screening recommendations/referrals: Colonoscopy: N/A Mammogram: Up to date Bone Density: Up to date Recommended yearly ophthalmology/optometry visit for glaucoma screening and checkup Recommended yearly dental visit for hygiene and checkup  Vaccinations: Influenza vaccine: Up to date Pneumococcal vaccine: Up to date Tdap vaccine: Up to date Shingles vaccine: Pt declines today.     Advanced directives: On file.   Conditions/risks identified: None.   Next appointment: 03/18/18 @ 9 AM with Dr Beryle FlockBacigalupo. Pt declined scheduling the AWV for 2020.    Preventive Care 5365 Years and Older, Female Preventive care refers to lifestyle choices and visits with your health care provider that can promote health and wellness. What does preventive care include?  A yearly physical exam. This is also called an annual well check.  Dental exams once or twice a year.  Routine eye exams. Ask your health care provider how often you should have your eyes checked.  Personal lifestyle choices, including:  Daily care of your teeth and gums.  Regular physical activity.  Eating a healthy diet.  Avoiding tobacco and drug use.  Limiting alcohol use.  Practicing safe sex.  Taking low-dose aspirin every day.  Taking vitamin and mineral supplements as recommended by your health care provider. What happens during an annual well check? The services and screenings done by your health care provider during your annual well check will depend on your age, overall health, lifestyle risk factors, and family history of disease. Counseling  Your health care provider may ask you questions about your:  Alcohol use.  Tobacco use.  Drug use.  Emotional  well-being.  Home and relationship well-being.  Sexual activity.  Eating habits.  History of falls.  Memory and ability to understand (cognition).  Work and work Astronomerenvironment.  Reproductive health. Screening  You may have the following tests or measurements:  Height, weight, and BMI.  Blood pressure.  Lipid and cholesterol levels. These may be checked every 5 years, or more frequently if you are over 334 years old.  Skin check.  Lung cancer screening. You may have this screening every year starting at age 79 if you have a 30-pack-year history of smoking and currently smoke or have quit within the past 15 years.  Fecal occult blood test (FOBT) of the stool. You may have this test every year starting at age 950.  Flexible sigmoidoscopy or colonoscopy. You may have a sigmoidoscopy every 5 years or a colonoscopy every 10 years starting at age 79.  Hepatitis C blood test.  Hepatitis B blood test.  Sexually transmitted disease (STD) testing.  Diabetes screening. This is done by checking your blood sugar (glucose) after you have not eaten for a while (fasting). You may have this done every 1-3 years.  Bone density scan. This is done to screen for osteoporosis. You may have this done starting at age 79.  Mammogram. This may be done every 1-2 years. Talk to your health care provider about how often you should have regular mammograms. Talk with your health care provider about your test results, treatment options, and if necessary, the need for more tests. Vaccines  Your health care provider may recommend certain vaccines, such as:  Influenza vaccine. This is recommended every year.  Tetanus, diphtheria, and acellular  pertussis (Tdap, Td) vaccine. You may need a Td booster every 10 years.  Zoster vaccine. You may need this after age 18.  Pneumococcal 13-valent conjugate (PCV13) vaccine. One dose is recommended after age 31.  Pneumococcal polysaccharide (PPSV23) vaccine. One  dose is recommended after age 58. Talk to your health care provider about which screenings and vaccines you need and how often you need them. This information is not intended to replace advice given to you by your health care provider. Make sure you discuss any questions you have with your health care provider. Document Released: 08/04/2015 Document Revised: 03/27/2016 Document Reviewed: 05/09/2015 Elsevier Interactive Patient Education  2017 [place] Prevention in the Home Falls can cause injuries. They can happen to people of all ages. There are many things you can do to make your home safe and to help prevent falls. What can I do on the outside of my home?  Regularly fix the edges of walkways and driveways and fix any cracks.  Remove anything that might make you trip as you walk through a door, such as a raised step or threshold.  Trim any bushes or trees on the path to your home.  Use bright outdoor lighting.  Clear any walking paths of anything that might make someone trip, such as rocks or tools.  Regularly check to see if handrails are loose or broken. Make sure that both sides of any steps have handrails.  Any raised decks and porches should have guardrails on the edges.  Have any leaves, snow, or ice cleared regularly.  Use sand or salt on walking paths during winter.  Clean up any spills in your garage right away. This includes oil or grease spills. What can I do in the bathroom?  Use night lights.  Install grab bars by the toilet and in the tub and shower. Do not use towel bars as grab bars.  Use non-skid mats or decals in the tub or shower.  If you need to sit down in the shower, use a plastic, non-slip stool.  Keep the floor dry. Clean up any water that spills on the floor as soon as it happens.  Remove soap buildup in the tub or shower regularly.  Attach bath mats securely with double-sided non-slip rug tape.  Do not have throw rugs and other  things on the floor that can make you trip. What can I do in the bedroom?  Use night lights.  Make sure that you have a light by your bed that is easy to reach.  Do not use any sheets or blankets that are too big for your bed. They should not hang down onto the floor.  Have a firm chair that has side arms. You can use this for support while you get dressed.  Do not have throw rugs and other things on the floor that can make you trip. What can I do in the kitchen?  Clean up any spills right away.  Avoid walking on wet floors.  Keep items that you use a lot in easy-to-reach places.  If you need to reach something above you, use a strong step stool that has a grab bar.  Keep electrical cords out of the way.  Do not use floor polish or wax that makes floors slippery. If you must use wax, use non-skid floor wax.  Do not have throw rugs and other things on the floor that can make you trip. What can I do with my stairs?  Do not leave any items on the stairs.  Make sure that there are handrails on both sides of the stairs and use them. Fix handrails that are broken or loose. Make sure that handrails are as long as the stairways.  Check any carpeting to make sure that it is firmly attached to the stairs. Fix any carpet that is loose or worn.  Avoid having throw rugs at the top or bottom of the stairs. If you do have throw rugs, attach them to the floor with carpet tape.  Make sure that you have a light switch at the top of the stairs and the bottom of the stairs. If you do not have them, ask someone to add them for you. What else can I do to help prevent falls?  Wear shoes that:  Do not have high heels.  Have rubber bottoms.  Are comfortable and fit you well.  Are closed at the toe. Do not wear sandals.  If you use a stepladder:  Make sure that it is fully opened. Do not climb a closed stepladder.  Make sure that both sides of the stepladder are locked into place.  Ask  someone to hold it for you, if possible.  Clearly mark and make sure that you can see:  Any grab bars or handrails.  First and last steps.  Where the edge of each step is.  Use tools that help you move around (mobility aids) if they are needed. These include:  Canes.  Walkers.  Scooters.  Crutches.  Turn on the lights when you go into a dark area. Replace any light bulbs as soon as they burn out.  Set up your furniture so you have a clear path. Avoid moving your furniture around.  If any of your floors are uneven, fix them.  If there are any pets around you, be aware of where they are.  Review your medicines with your doctor. Some medicines can make you feel dizzy. This can increase your chance of falling. Ask your doctor what other things that you can do to help prevent falls. This information is not intended to replace advice given to you by your health care provider. Make sure you discuss any questions you have with your health care provider. Document Released: 05/04/2009 Document Revised: 12/14/2015 Document Reviewed: 08/12/2014 Elsevier Interactive Patient Education  2017 [employer].

## 2018-03-13 NOTE — Progress Notes (Signed)
Subjective:   Natalie Pena is a 77 y.o. female who presents for Medicare Annual (Subsequent) preventive examination.  Review of Systems:  N/A  Cardiac Risk Factors include: advanced age (>63men, >17 women)     Objective:     Vitals: BP (!) 126/56 (BP Location: Right Arm)   Pulse 77   Temp 99 F (37.2 C) (Oral)   Ht 5\' 4"  (1.626 m)   Wt 140 lb 12.8 oz (63.9 kg)   BMI 24.17 kg/m   Body mass index is 24.17 kg/m.  Advanced Directives 03/13/2018 03/12/2017 03/13/2016 01/25/2015  Does Patient Have a Medical Advance Directive? Yes No Yes Yes  Type of Estate agent of [place];Living will - Living will Living will  Does patient want to make changes to medical advance directive? - No - Patient declined - -  Copy of Healthcare Power of Attorney in Chart? Yes - - -    Tobacco Social History   Tobacco Use  Smoking Status Never Smoker  Smokeless Tobacco Never Used  Tobacco Comment   tobacco use - no     Counseling given: Not Answered Comment: tobacco use - no   Clinical Intake:  Pre-visit preparation completed: Yes  Pain : No/denies pain Pain Score: 0-No pain     Nutritional Status: BMI of 19-24  Normal Nutritional Risks: None Diabetes: No  How often do you need to have someone help you when you read instructions, pamphlets, or other written materials from your doctor or pharmacy?: 1 - Never  Interpreter Needed?: No  Information entered by :: [facility], LPN  Past Medical History:  Diagnosis Date  . Hypercholesterolemia   . Osteoporosis    Past Surgical History:  Procedure Laterality Date  . APPENDECTOMY    . BILATERAL ANTERIOR TOTAL HIP ARTHROPLASTY    . CESAREAN SECTION  1973  . EYE SURGERY Left 05/2014   cataract surgery  . TONSILLECTOMY AND ADENOIDECTOMY  1940  . TOTAL ABDOMINAL HYSTERECTOMY  1974   fibroids  . unilateral oophorectomy  1974   left   Family History  Problem Relation Age of Onset  . Pancreatic cancer Father     . Heart attack Mother   . Breast cancer Paternal Grandmother 19  . Stroke Sister    Social History   Socioeconomic History  . Marital status: Widowed    Spouse name: Not on file  . Number of children: 2  . 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 Highest education level: Not on file  Occupational History  . Occupation: retired  Engineer, production  . Financial resource strain: Not hard at all  . Food insecurity:    Worry: Never true    Inability: Never true  . Transportation needs:    Medical: No    Non-medical: No  Tobacco Use  . Smoking status: Never Smoker  . Smokeless tobacco: Never Used  . Tobacco comment: tobacco use - no  Substance and Sexual Activity  . Alcohol use: No    Alcohol/week: 0.0 standard drinks  . Drug use: No  . Sexual activity: Never  Lifestyle  . Physical activity:    Days per week: Not on file    Minutes per session: Not on file  . Stress: Only a little  Relationships  . Social connections:    Talks on phone: Not on file    Gets together: Not on file    Attends religious service: Not on file    Active member  of club or organization: Not on file    Attends meetings of clubs or organizations: Not on file    Relationship status: Not on file  Other Topics Concern  . Not on file  Social History Narrative   Widowed, retired, gets regular exercise.     Outpatient Encounter Medications as of 03/13/2018  Medication Sig  . aspirin 81 MG EC tablet Take 81 mg by mouth.   . cholecalciferol (VITAMIN D) 1000 units tablet Take 1,000 Units by mouth daily.  Marland Kitchen. FLAXSEED, LINSEED, PO Take 1,000 mg by mouth daily.   Marland Kitchen. ibuprofen (ADVIL,MOTRIN) 200 MG tablet Take 200 mg by mouth every 6 (six) hours as needed.    . loratadine (CLARITIN) 10 MG tablet Take 10 mg by mouth daily as needed for allergies.  . Magnesium 250 MG TABS Take 250 mg by mouth.  . Multiple Vitamin (MULTIVITAMIN) tablet Take 1 tablet by mouth daily.   . cyanocobalamin 1000 MCG tablet  Take 1,000 mcg by mouth daily.   No facility-administered encounter medications on file as of 03/13/2018.     Activities of Daily Living In your present state of health, do you have any difficulty performing the following activities: 03/13/2018  Hearing? N  Vision? N  Difficulty concentrating or making decisions? N  Walking or climbing stairs? N  Dressing or bathing? N  Doing errands, shopping? N  Preparing Food and eating ? N  Using the Toilet? N  In the past six months, have you accidently leaked urine? N  Do you have problems with loss of bowel control? N  Managing your Medications? N  Managing your Finances? N  Housekeeping or managing your Housekeeping? N  Some recent data might be hidden    Patient Care Team: Erasmo DownerBacigalupo, Angela M, MD as PCP - General (Family Medicine) Lockie MolaBrasington, Chadwick, MD as Referring Physician (Ophthalmology)    Assessment:   This is a routine wellness examination for Natalie Pena.  Exercise Activities and Dietary recommendations Current Exercise Habits: Home exercise routine, Type of exercise: stretching;walking, Time (Minutes): 20, Frequency (Times/Week): 7, Weekly Exercise (Minutes/Week): 140, Intensity: Mild, Exercise limited by: None identified  Goals    . Exercise 150 minutes per week (moderate activity)    . Exercise 3x per week (20 min per time)     Recommend exercising (walking) three times a week for 20 minutes.        Fall Risk Fall Risk  03/13/2018 03/12/2017 03/13/2016 01/25/2015  Falls in the past year? No No Yes No  Number falls in past yr: - - 1 -  Injury with Fall? - - No -   Is the patient's home free of loose throw rugs in walkways, pet beds, electrical cords, etc?   yes      Grab bars in the bathroom? yes      Handrails on the stairs?   no      Adequate lighting?   yes  Timed Get Up and Go performed: N/A  Depression Screen PHQ 2/9 Scores 03/13/2018 03/12/2017 03/13/2016 01/25/2015  PHQ - 2 Score 0 0 0 0     Cognitive Function:       6CIT Screen 03/13/2018  What Year? 0 points  What month? 0 points  What time? 0 points  Count back from 20 0 points  Months in reverse 0 points  Repeat phrase 2 points  Total Score 2    Immunization History  Administered Date(s) Administered  . Influenza, High Dose Seasonal PF 04/11/2017  .  Pneumococcal Conjugate-13 12/29/2013  . Pneumococcal Polysaccharide-23 07/22/2005  . Tdap 08/09/2010    Qualifies for Shingles Vaccine? Due for Shingles vaccine. Declined my offer to administer today. Education has been provided regarding the importance of this vaccine. Pt has been advised to call her insurance company to determine her out of pocket expense. Advised she may also receive this vaccine at her local pharmacy or Health Dept. Verbalized acceptance and understanding.  Screening Tests Health Maintenance  Topic Date Due  . INFLUENZA VACCINE  02/19/2018  . TETANUS/TDAP  08/09/2020  . DEXA SCAN  Completed  . PNA vac Low Risk Adult  Completed    Cancer Screenings: Lung: Low Dose CT Chest recommended if Age 53-80 years, 30 pack-year currently smoking OR have quit w/in 15years. Patient does not qualify. Breast:  Up to date on Mammogram? Yes   Up to date of Bone Density/Dexa? Yes Colorectal: N/A  Additional Screenings:  Hepatitis C Screening: N/A     Plan:  I have personally reviewed and addressed the Medicare Annual Wellness questionnaire and have noted the following in the patient's chart:  A. Medical and social history B. Use of alcohol, tobacco or illicit drugs  C. Current medications and supplements D. Functional ability and status E.  Nutritional status F.  Physical activity G. Advance directives H. List of other physicians I.  Hospitalizations, surgeries, and ER visits in previous 12 months J.  Vitals K. Screenings such as hearing and vision if needed, cognitive and depression L. Referrals and appointments - none  In addition, I have reviewed and discussed with  patient certain preventive protocols, quality metrics, and best practice recommendations. A written personalized care plan for preventive services as well as general preventive health recommendations were provided to patient.  See attached scanned questionnaire for additional information.   Signed,  Hyacinth Meeker, LPN Nurse Health Advisor   Nurse Recommendations: None.

## 2018-03-18 ENCOUNTER — Ambulatory Visit (INDEPENDENT_AMBULATORY_CARE_PROVIDER_SITE_OTHER): Payer: Medicare Other | Admitting: Family Medicine

## 2018-03-18 ENCOUNTER — Encounter: Payer: Self-pay | Admitting: Family Medicine

## 2018-03-18 VITALS — BP 116/70 | HR 78 | Temp 98.2°F | Ht 64.0 in | Wt 137.4 lb

## 2018-03-18 DIAGNOSIS — E559 Vitamin D deficiency, unspecified: Secondary | ICD-10-CM | POA: Diagnosis not present

## 2018-03-18 DIAGNOSIS — E7800 Pure hypercholesterolemia, unspecified: Secondary | ICD-10-CM | POA: Diagnosis not present

## 2018-03-18 DIAGNOSIS — Z0000 Encounter for general adult medical examination without abnormal findings: Secondary | ICD-10-CM | POA: Diagnosis not present

## 2018-03-18 DIAGNOSIS — R7303 Prediabetes: Secondary | ICD-10-CM | POA: Diagnosis not present

## 2018-03-18 NOTE — Progress Notes (Signed)
Patient: Natalie Pena, Female    DOB: [birth year], 77 y.o.   MRN: [id number] Visit Date: 03/18/2018  Today's Provider: Shirlee Latch, MD   Chief Complaint  Patient presents with  . Annual Exam   Subjective:  I, Presley Raddle, CMA, am acting as a scribe for Shirlee Latch, MD.   Patient had a AWE with McKenzie on 03/13/18.   Complete Physical Natalie Pena is a 77 y.o. female. She feels well. She reports exercising daily. She reports she is sleeping fairly well.  -----------------------------------------------------------   Review of Systems  Constitutional: Negative.   HENT: Negative.   Eyes: Negative.   Respiratory: Negative.   Cardiovascular: Negative.   Gastrointestinal: Negative.   Endocrine: Negative.   Genitourinary: Negative.   Musculoskeletal: Negative.   Skin: Negative.   Allergic/Immunologic: Negative.   Neurological: Negative.   Hematological: Negative.   Psychiatric/Behavioral: Negative.     Social History   Socioeconomic History  . Marital status: Widowed    Spouse name: Not on file  . Number of children: 2  . 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 Highest education level: Not on file  Occupational History  . Occupation: retired  Engineer, production  . Financial resource strain: Not hard at all  . Food insecurity:    Worry: Never true    Inability: Never true  . Transportation needs:    Medical: No    Non-medical: No  Tobacco Use  . Smoking status: Never Smoker  . Smokeless tobacco: Never Used  . Tobacco comment: tobacco use - no  Substance and Sexual Activity  . Alcohol use: No    Alcohol/week: 0.0 standard drinks  . Drug use: No  . Sexual activity: Never  Lifestyle  . Physical activity:    Days per week: Not on file    Minutes per session: Not on file  . Stress: Only a little  Relationships  . Social connections:    Talks on phone: Not on file    Gets together: Not on file    Attends religious service: Not  on file    Active member of club or organization: Not on file    Attends meetings of clubs or organizations: Not on file    Relationship status: Not on file  . Intimate partner violence:    Fear of current or ex partner: Not on file    Emotionally abused: Not on file    Physically abused: Not on file    Forced sexual activity: Not on file  Other Topics Concern  . Not on file  Social History Narrative   Widowed, retired, gets regular exercise.     Past Medical History:  Diagnosis Date  . Hypercholesterolemia   . Osteoporosis      Patient Active Problem List   Diagnosis Date Noted  . Prediabetes 03/17/2017  . Difficulty hearing 11/23/2014  . Avitaminosis D 11/23/2014  . OP (osteoporosis) 06/15/2008  . Hypercholesteremia 09/21/2007  . Family history of cardiovascular disease 03/24/2007  . Atonic constipation 03/24/2007    Past Surgical History:  Procedure Laterality Date  . APPENDECTOMY    . BILATERAL ANTERIOR TOTAL HIP ARTHROPLASTY    . CESAREAN SECTION  1973  . EYE SURGERY Left 05/2014   cataract surgery  . TONSILLECTOMY AND ADENOIDECTOMY  1940  . TOTAL ABDOMINAL HYSTERECTOMY  1974   fibroids  . unilateral oophorectomy  1974   left    Her family history includes Breast cancer (age  of onset: 4988) in her paternal grandmother; Heart attack in her mother; Pancreatic cancer in her father; Stroke in her sister.      Current Outpatient Medications:  .  aspirin 81 MG EC tablet, Take 81 mg by mouth. , Disp: , Rfl:  .  cholecalciferol (VITAMIN D) 1000 units tablet, Take 1,000 Units by mouth daily., Disp: , Rfl:  .  cyanocobalamin 1000 MCG tablet, Take 1,000 mcg by mouth daily., Disp: , Rfl:  .  FLAXSEED, LINSEED, PO, Take 1,000 mg by mouth daily. , Disp: , Rfl:  .  ibuprofen (ADVIL,MOTRIN) 200 MG tablet, Take 200 mg by mouth every 6 (six) hours as needed.  , Disp: , Rfl:  .  loratadine (CLARITIN) 10 MG tablet, Take 10 mg by mouth daily as needed for allergies., Disp: , Rfl:   .  Magnesium 250 MG TABS, Take 250 mg by mouth., Disp: , Rfl:  .  Multiple Vitamin (MULTIVITAMIN) tablet, Take 1 tablet by mouth daily. , Disp: , Rfl:   Patient Care Team: Erasmo DownerBacigalupo, Angela M, MD as PCP - General (Family Medicine) Lockie MolaBrasington, Chadwick, MD as Referring Physician (Ophthalmology)     Objective:   Vitals: BP 116/70 (BP Location: Right Arm, Patient Position: Sitting, Cuff Size: Normal)   Pulse 78   Temp 98.2 F (36.8 C) (Oral)   Ht 5\' 4"  (1.626 m)   Wt 137 lb 6.4 oz (62.3 kg)   SpO2 96%   BMI 23.58 kg/m   Physical Exam  Constitutional: She is oriented to person, place, and time. She appears well-developed and well-nourished. No distress.  HENT:  Head: Normocephalic and atraumatic.  Right Ear: External ear normal.  Left Ear: External ear normal.  Nose: Nose normal.  Mouth/Throat: Oropharynx is clear and moist.  Eyes: Pupils are equal, round, and reactive to light. Conjunctivae and EOM are normal. No scleral icterus.  Neck: Neck supple. No thyromegaly present.  Cardiovascular: Normal rate, regular rhythm, normal heart sounds and intact distal pulses.  No murmur heard. Pulmonary/Chest: Effort normal and breath sounds normal. No respiratory distress. She has no wheezes. She has no rales.  Abdominal: Soft. Bowel sounds are normal. She exhibits no distension. There is no tenderness. There is no rebound and no guarding.  Musculoskeletal: She exhibits no edema or deformity.  Lymphadenopathy:    She has no cervical adenopathy.  Neurological: She is alert and oriented to person, place, and time.  Skin: Skin is warm and dry. Capillary refill takes less than 2 seconds. No rash noted.  Psychiatric: She has a normal mood and affect. Her behavior is normal.  Vitals reviewed.   Activities of Daily Living In your present state of health, do you have any difficulty performing the following activities: 03/13/2018  Hearing? N  Vision? N  Difficulty concentrating or making  decisions? N  Walking or climbing stairs? N  Dressing or bathing? N  Doing errands, shopping? N  Preparing Food and eating ? N  Using the Toilet? N  In the past six months, have you accidently leaked urine? N  Do you have problems with loss of bowel control? N  Managing your Medications? N  Managing your Finances? N  Housekeeping or managing your Housekeeping? N  Some recent data might be hidden    Fall Risk Assessment Fall Risk  03/13/2018 03/12/2017 03/13/2016 01/25/2015  Falls in the past year? No No Yes No  Number falls in past yr: - - 1 -  Injury with Fall? - - No -  Depression Screen PHQ 2/9 Scores 03/13/2018 03/12/2017 03/13/2016 01/25/2015  PHQ - 2 Score 0 0 0 0     Assessment & Plan:    Annual Physical Reviewed patient's Family Medical History Reviewed and updated list of patient's medical providers Assessment of cognitive impairment was done Assessed patient's functional ability Established a written schedule for health screening services Health Risk Assessent Completed and Reviewed  Exercise Activities and Dietary recommendations Goals    . Exercise 150 minutes per week (moderate activity)    . Exercise 3x per week (20 min per time)     Recommend exercising (walking) three times a week for 20 minutes.        Immunization History  Administered Date(s) Administered  . Influenza, High Dose Seasonal PF 04/11/2017  . Pneumococcal Conjugate-13 12/29/2013  . Pneumococcal Polysaccharide-23 07/22/2005  . Tdap 08/09/2010    Health Maintenance  Topic Date Due  . INFLUENZA VACCINE  02/19/2018  . TETANUS/TDAP  08/09/2020  . DEXA SCAN  Completed  . PNA vac Low Risk Adult  Completed     Discussed health benefits of physical activity, and encouraged her to engage in regular exercise appropriate for her age and condition.    ------------------------------------------------------------------------------------------------------------  Problem List Items Addressed  This Visit      Other   Hypercholesteremia   Relevant Orders   Comprehensive metabolic panel   Lipid panel   CBC w/Diff/Platelet   TSH   Avitaminosis D   Relevant Orders   CBC w/Diff/Platelet   TSH   VITAMIN D 25 Hydroxy (Vit-D Deficiency, Fractures)   Prediabetes   Relevant Orders   CBC w/Diff/Platelet   Hemoglobin A1c   TSH    Other Visit Diagnoses    Encounter for annual physical exam    -  Primary       Return in about 1 year (around 03/19/2019) for Physical and Annual Wellness Visit.   The entirety of the information documented in the History of Present Illness, Review of Systems and Physical Exam were personally obtained by me. Portions of this information were initially documented by Presley Raddle, CMA and reviewed by me for thoroughness and accuracy.    Erasmo Downer, MD, MPH [facility] 03/18/2018 9:10 AM

## 2018-03-18 NOTE — Patient Instructions (Signed)
Preventive Care 79 Years and Older, Female Preventive care refers to lifestyle choices and visits with your health care provider that can promote health and wellness. What does preventive care include?  A yearly physical exam. This is also called an annual well check.  Dental exams once or twice a year.  Routine eye exams. Ask your health care provider how often you should have your eyes checked.  Personal lifestyle choices, including: ? Daily care of your teeth and gums. ? Regular physical activity. ? Eating a healthy diet. ? Avoiding tobacco and drug use. ? Limiting alcohol use. ? Practicing safe sex. ? Taking low-dose aspirin every day. ? Taking vitamin and mineral supplements as recommended by your health care provider. What happens during an annual well check? The services and screenings done by your health care provider during your annual well check will depend on your age, overall health, lifestyle risk factors, and family history of disease. Counseling Your health care provider may ask you questions about your:  Alcohol use.  Tobacco use.  Drug use.  Emotional well-being.  Home and relationship well-being.  Sexual activity.  Eating habits.  History of falls.  Memory and ability to understand (cognition).  Work and work environment.  Reproductive health.  Screening You may have the following tests or measurements:  Height, weight, and BMI.  Blood pressure.  Lipid and cholesterol levels. These may be checked every 5 years, or more frequently if you are over 50 years old.  Skin check.  Lung cancer screening. You may have this screening every year starting at age 79 if you have a 30-pack-year history of smoking and currently smoke or have quit within the past 15 years.  Fecal occult blood test (FOBT) of the stool. You may have this test every year starting at age 79.  Flexible sigmoidoscopy or colonoscopy. You may have a sigmoidoscopy every 5 years or  a colonoscopy every 10 years starting at age 79.  Hepatitis C blood test.  Hepatitis B blood test.  Sexually transmitted disease (STD) testing.  Diabetes screening. This is done by checking your blood sugar (glucose) after you have not eaten for a while (fasting). You may have this done every 1-3 years.  Bone density scan. This is done to screen for osteoporosis. You may have this done starting at age 79.  Mammogram. This may be done every 1-2 years. Talk to your health care provider about how often you should have regular mammograms.  Talk with your health care provider about your test results, treatment options, and if necessary, the need for more tests. Vaccines Your health care provider may recommend certain vaccines, such as:  Influenza vaccine. This is recommended every year.  Tetanus, diphtheria, and acellular pertussis (Tdap, Td) vaccine. You may need a Td booster every 10 years.  Varicella vaccine. You may need this if you have not been vaccinated.  Zoster vaccine. You may need this after age 79.  Measles, mumps, and rubella (MMR) vaccine. You may need at least one dose of MMR if you were born in 1957 or later. You may also need a second dose.  Pneumococcal 13-valent conjugate (PCV13) vaccine. One dose is recommended after age 79.  Pneumococcal polysaccharide (PPSV23) vaccine. One dose is recommended after age 79.  Meningococcal vaccine. You may need this if you have certain conditions.  Hepatitis A vaccine. You may need this if you have certain conditions or if you travel or work in places where you may be exposed to hepatitis   A.  Hepatitis B vaccine. You may need this if you have certain conditions or if you travel or work in places where you may be exposed to hepatitis B.  Haemophilus influenzae type b (Hib) vaccine. You may need this if you have certain conditions.  Talk to your health care provider about which screenings and vaccines you need and how often you  need them. This information is not intended to replace advice given to you by your health care provider. Make sure you discuss any questions you have with your health care provider. Document Released: 08/04/2015 Document Revised: 03/27/2016 Document Reviewed: 05/09/2015 Elsevier Interactive Patient Education  2018 [employer]  

## 2018-03-19 ENCOUNTER — Telehealth: Payer: Self-pay

## 2018-03-19 LAB — CBC WITH DIFFERENTIAL/PLATELET
BASOS: 1 %
Basophils Absolute: 0 10*3/uL (ref 0.0–0.2)
EOS (ABSOLUTE): 0.1 10*3/uL (ref 0.0–0.4)
EOS: 2 %
HEMATOCRIT: 44 % (ref 34.0–46.6)
Hemoglobin: 14.9 g/dL (ref 11.1–15.9)
IMMATURE GRANULOCYTES: 0 %
Immature Grans (Abs): 0 10*3/uL (ref 0.0–0.1)
LYMPHS ABS: 1.8 10*3/uL (ref 0.7–3.1)
Lymphs: 31 %
MCH: 29.7 pg (ref 26.6–33.0)
MCHC: 33.9 g/dL (ref 31.5–35.7)
MCV: 88 fL (ref 79–97)
MONOS ABS: 0.6 10*3/uL (ref 0.1–0.9)
Monocytes: 11 %
NEUTROS PCT: 55 %
Neutrophils Absolute: 3.2 10*3/uL (ref 1.4–7.0)
PLATELETS: 269 10*3/uL (ref 150–450)
RBC: 5.01 x10E6/uL (ref 3.77–5.28)
RDW: 13.1 % (ref 12.3–15.4)
WBC: 5.7 10*3/uL (ref 3.4–10.8)

## 2018-03-19 LAB — COMPREHENSIVE METABOLIC PANEL
ALK PHOS: 54 IU/L (ref 39–117)
ALT: 15 IU/L (ref 0–32)
AST: 16 IU/L (ref 0–40)
Albumin/Globulin Ratio: 2 (ref 1.2–2.2)
Albumin: 4.5 g/dL (ref 3.5–4.8)
BUN / CREAT RATIO: 17 (ref 12–28)
BUN: 12 mg/dL (ref 8–27)
Bilirubin Total: 0.5 mg/dL (ref 0.0–1.2)
CHLORIDE: 100 mmol/L (ref 96–106)
CO2: 22 mmol/L (ref 20–29)
CREATININE: 0.72 mg/dL (ref 0.57–1.00)
Calcium: 9.6 mg/dL (ref 8.7–10.3)
GFR calc non Af Amer: 80 mL/min/{1.73_m2} (ref >59)
GFR, EST AFRICAN AMERICAN: 92 mL/min/{1.73_m2} (ref >59)
GLUCOSE: 95 mg/dL (ref 65–99)
Globulin, Total: 2.3 g/dL (ref 1.5–4.5)
Potassium: 4.4 mmol/L (ref 3.5–5.2)
Sodium: 139 mmol/L (ref 134–144)
Total Protein: 6.8 g/dL (ref 6.0–8.5)

## 2018-03-19 LAB — LIPID PANEL
Chol/HDL Ratio: 2 ratio (ref 0.0–4.4)
Cholesterol, Total: 246 mg/dL — ABNORMAL HIGH (ref 100–199)
HDL: 123 mg/dL (ref >39)
LDL CALC: 108 mg/dL — AB (ref 0–99)
Triglycerides: 76 mg/dL (ref 0–149)
VLDL CHOLESTEROL CAL: 15 mg/dL (ref 5–40)

## 2018-03-19 LAB — TSH: TSH: 4.17 u[IU]/mL (ref 0.450–4.500)

## 2018-03-19 LAB — HEMOGLOBIN A1C
Est. average glucose Bld gHb Est-mCnc: 114 mg/dL
Hgb A1c MFr Bld: 5.6 % (ref 4.8–5.6)

## 2018-03-19 LAB — VITAMIN D 25 HYDROXY (VIT D DEFICIENCY, FRACTURES): Vit D, 25-Hydroxy: 35.1 ng/mL (ref 30.0–100.0)

## 2018-03-19 NOTE — Telephone Encounter (Signed)
Pt returned call

## 2018-03-19 NOTE — Telephone Encounter (Signed)
LMTCB

## 2018-03-19 NOTE — Telephone Encounter (Signed)
-----   Message from Erasmo DownerAngela M Bacigalupo, MD sent at 03/19/2018  9:01 AM EDT ----- Normal Blood counts, kidney function, liver function, electrolytes, Vit D, Thyroid function, blood sugar.  Cholesterol is stable and in an ok range.  Erasmo DownerBacigalupo, Angela M, MD, MPH [facility] 03/19/2018 9:01 AM

## 2018-03-19 NOTE — Telephone Encounter (Signed)
Yes that's fine. Do you mind getting these printed and mailed?  Erasmo DownerBacigalupo, Angela M, MD, MPH [facility] 03/19/2018 3:48 PM

## 2018-03-19 NOTE — Telephone Encounter (Signed)
Patient advised and verbally voiced understanding. Patient request that we mail a copy of her lab results to her home. She says that Dr. B said it was ok to mail them to her. Please confirm that you would like labs mailed to patient's home.Thanks.

## 2018-03-19 NOTE — Telephone Encounter (Signed)
Lab results mailed to address in chart per Dr. Beryle FlockBacigalupo.

## 2020-02-17 ENCOUNTER — Encounter: Payer: Medicare Other | Admitting: Family Medicine

## 2020-10-17 ENCOUNTER — Encounter: Payer: Medicare Other | Admitting: Family Medicine

## 2020-12-19 DIAGNOSIS — H353132 Nonexudative age-related macular degeneration, bilateral, intermediate dry stage: Secondary | ICD-10-CM | POA: Diagnosis not present

## 2021-02-06 ENCOUNTER — Encounter: Payer: Self-pay | Admitting: Family Medicine

## 2021-02-06 ENCOUNTER — Ambulatory Visit (INDEPENDENT_AMBULATORY_CARE_PROVIDER_SITE_OTHER): Payer: Medicare Other | Admitting: Family Medicine

## 2021-02-06 ENCOUNTER — Other Ambulatory Visit: Payer: Self-pay

## 2021-02-06 VITALS — BP 140/69 | HR 68 | Temp 98.3°F | Resp 16 | Ht 64.0 in | Wt 138.4 lb

## 2021-02-06 DIAGNOSIS — R7303 Prediabetes: Secondary | ICD-10-CM

## 2021-02-06 DIAGNOSIS — E7800 Pure hypercholesterolemia, unspecified: Secondary | ICD-10-CM

## 2021-02-06 DIAGNOSIS — E559 Vitamin D deficiency, unspecified: Secondary | ICD-10-CM

## 2021-02-06 DIAGNOSIS — M810 Age-related osteoporosis without current pathological fracture: Secondary | ICD-10-CM | POA: Diagnosis not present

## 2021-02-06 DIAGNOSIS — Z0000 Encounter for general adult medical examination without abnormal findings: Secondary | ICD-10-CM

## 2021-02-06 NOTE — Patient Instructions (Addendum)
The CDC recommends two doses of Shingrix (the shingles vaccine) separated by 2 to 6 months for adults age 82 years and older. I recommend checking with your insurance plan regarding coverage for this vaccine.   Check with the pharmacy for the tetanus shot  Consider 4th COVID vaccine

## 2021-02-06 NOTE — Assessment & Plan Note (Signed)
Recommend low carb diet °Recheck A1c  °

## 2021-02-06 NOTE — Assessment & Plan Note (Signed)
Declines repeat DEXA as she would not treat despite results Encourage Ca and Vit D supplementation and weight bearing exercise

## 2021-02-06 NOTE — Assessment & Plan Note (Signed)
Reviewed last lipid panel Not currently on a statin Recheck FLP and CMP Discussed diet and exercise  

## 2021-02-06 NOTE — Progress Notes (Signed)
Complete physical exam/AWV   Patient: Natalie Pena   DOB: Jan 13, 1941   80 y.o. Female  MRN: [id number] Visit Date: 02/06/2021  Today's healthcare provider: Shirlee Latch, MD   Chief Complaint  Patient presents with   Annual Exam   Subjective    Natalie Pena is a 80 y.o. female who presents today for a complete physical exam/AWV.  She reports consuming a  diet. Home exercise routine includes walking daily. She generally feels well. She reports sleeping fairly well. She does not have additional problems to discuss today.   HPI   Crepitus  She has an uncomfortable "crackling" in the bones in her neck. It bothers her to sleep on her left side and can bother her while sitting up  Vaccines She has received 3 COVID vaccine and eligible for the 4th booster. She reports having 1 dosage of the shingrix up-to-date with her pneumonia. It has been about 10 years since her tetanus vaccine.   Labs She is requesting for a copy of her blood panel results.  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    Social Determinants of Health   Financial Resource Strain: Not on file  Food Insecurity: Not on file  Transportation Needs: Not on file  Physical Activity: Not on file  Stress: Not on file  Social Connections: Not on file  Intimate Partner Violence: Not on file   Family Status  Relation Name Status   Father  Deceased       pancreatic cancer   Mother  Deceased       MI, CAd   PGM  (Not Specified)   Sister 1/2 sister Alive   Family History  Problem Relation Age of Onset   Pancreatic cancer Father    Heart attack Mother    Breast cancer Paternal Grandmother 14   Stroke Sister    Allergies  Allergen Reactions   Shellfish Allergy     Patient Care Team: Cletus Paris, Marzella Schlein, MD as PCP - General (Family Medicine) Lockie Mola, MD as Referring Physician (Ophthalmology)   Medications: Outpatient Medications Prior to Visit  Medication Sig   cholecalciferol (VITAMIN D) 1000 units tablet Take 1,000 Units by mouth daily.   cyanocobalamin 1000 MCG tablet Take 1,000 mcg by mouth daily.   FLAXSEED,  LINSEED, PO Take 1,000 mg by mouth daily.    ibuprofen (ADVIL,MOTRIN) 200 MG tablet Take 200 mg by mouth every 6 (six) hours as needed.     loratadine (CLARITIN) 10 MG tablet Take 10 mg by mouth daily as needed for allergies.   Magnesium 250 MG TABS Take 250 mg by mouth.   Multiple Vitamin (MULTIVITAMIN) tablet Take 1 tablet by mouth daily.    [DISCONTINUED] aspirin 81 MG EC tablet Take 81 mg by mouth.    No facility-administered medications prior to visit.    Review of Systems  Constitutional:  Negative for chills, fatigue and fever.  HENT:  Negative for ear pain, sinus pressure, sinus pain and sore throat.   Eyes:  Negative for pain and visual disturbance.  Respiratory:  Negative for cough, chest tightness, shortness of breath and wheezing.   Cardiovascular:   Negative for chest pain, palpitations and leg swelling.  Gastrointestinal:  Negative for abdominal pain, blood in stool, diarrhea, nausea and vomiting.  Genitourinary:  Negative for flank pain, frequency, pelvic pain and urgency.  Musculoskeletal:  Negative for back pain, myalgias and neck pain.  Neurological:  Negative for dizziness, seizures, syncope, weakness, light-headedness, numbness and headaches.  All other systems reviewed and are negative.    Objective    BP 140/69   Pulse 68   Temp 98.3 F (36.8 C) (Oral)   Resp 16   Ht 5\' 4"  (1.626 m)   Wt 138 lb 6.4 oz (62.8 kg)   SpO2 96%   BMI 23.76 kg/m    Physical Exam Vitals reviewed.  Constitutional:      General: She is not in acute distress.    Appearance: Normal appearance. She is well-developed. She is not diaphoretic.  HENT:     Head: Normocephalic and atraumatic.     Right Ear: Tympanic membrane, ear canal and external ear normal.     Left Ear: Tympanic membrane, ear canal and external ear normal.     Nose: Nose normal.     Mouth/Throat:     Mouth: Mucous membranes are moist.     Pharynx: Oropharynx is clear. No oropharyngeal exudate.  Eyes:     General: No scleral icterus.    Conjunctiva/sclera: Conjunctivae normal.     Pupils: Pupils are equal, round, and reactive to light.  Neck:     Thyroid: No thyromegaly.  Cardiovascular:     Rate and Rhythm: Normal rate and regular rhythm.     Pulses: Normal pulses.     Heart sounds: Normal heart sounds. No murmur heard. Pulmonary:     Effort: Pulmonary effort is normal. No respiratory distress.     Breath sounds: Normal breath sounds. No wheezing or rales.  Abdominal:     General: There is no distension.     Palpations: Abdomen is soft.     Tenderness: There is no abdominal tenderness.  Musculoskeletal:        General: No deformity.     Cervical back: Neck supple.     Right lower leg: No edema.     Left lower leg: No edema.  Lymphadenopathy:     Cervical: No  cervical adenopathy.  Skin:    General: Skin is warm and dry.     Findings: No rash.  Neurological:     Mental Status: She is alert and oriented to person, place, and time. Mental status is at baseline.     Sensory: No sensory deficit.     Motor: No weakness.  Gait: Gait normal.  Psychiatric:        Mood and Affect: Mood normal.        Behavior: Behavior normal.        Thought Content: Thought content normal.     Last depression screening scores PHQ 2/9 Scores 02/06/2021 03/13/2018 03/12/2017  PHQ - 2 Score 0 0 0  PHQ- 9 Score 3 - -   Last fall risk screening Fall Risk  02/06/2021  Falls in the past year? 0  Number falls in past yr: 0  Injury with Fall? 0   Last Audit-C alcohol use screening Alcohol Use Disorder Test (AUDIT) 02/06/2021  1. How often do you have a drink containing alcohol? 0  2. How many drinks containing alcohol do you have on a typical day when you are drinking? 0  3. How often do you have six or more drinks on one occasion? 0  AUDIT-C Score 0   A score of 3 or more in women, and 4 or more in men indicates increased risk for alcohol abuse, EXCEPT if all of the points are from question 1   No results found for any visits on 02/06/21.  Assessment & Plan     Problem List Items Addressed This Visit       Musculoskeletal and Integument   OP (osteoporosis)    Declines repeat DEXA as she would not treat despite results Encourage Ca and Vit D supplementation and weight bearing exercise       Relevant Orders   VITAMIN D 25 Hydroxy (Vit-D Deficiency, Fractures)     Other   Hypercholesteremia    Reviewed last lipid panel Not currently on a statin Recheck FLP and CMP Discussed diet and exercise        Relevant Orders   Comprehensive metabolic panel   Lipid panel   Avitaminosis D   Relevant Orders   VITAMIN D 25 Hydroxy (Vit-D Deficiency, Fractures)   Prediabetes    Recommend low carb diet Recheck A1c        Relevant Orders   Hemoglobin A1c    Other Visit Diagnoses     Encounter for annual wellness visit (AWV) in Medicare patient    -  Primary   Encounter for annual physical exam           Routine Health Maintenance and Physical Exam  Exercise Activities and Dietary recommendations  Goals      Exercise 150 minutes per week (moderate activity)     Exercise 3x per week (20 min per time)     Recommend exercising (walking) three times a week for 20 minutes.          Immunization History  Administered Date(s) Administered   Influenza, High Dose Seasonal PF 04/11/2017   Influenza, Seasonal, Injecte, Preservative Fre 03/30/2019   PFIZER Comirnaty(Gray Top)Covid-19 Tri-Sucrose Vaccine 08/25/2019, 09/15/2019, 06/06/2020   Pneumococcal Conjugate-13 12/29/2013   Pneumococcal Polysaccharide-23 07/22/2005   Tdap 08/09/2010    Health Maintenance  Topic Date Due   Zoster Vaccines- Shingrix (1 of 2) Never done   TETANUS/TDAP  08/09/2020   COVID-19 Vaccine (4 - Booster for [employer] series) 10/04/2020   INFLUENZA VACCINE  02/19/2021   DEXA SCAN  Completed   PNA vac Low Risk Adult  Completed   HPV VACCINES  Aged Out   Discussed health benefits of physical activity, and encouraged her to engage in regular exercise appropriate for her age and condition.  Return in about 1 year (around 02/06/2022) for  CPE, AWV.     I,Essence Turner,acting as a Neurosurgeonscribe for Shirlee LatchAngela Janalee Grobe, MD.,have documented all relevant documentation on the behalf of Shirlee LatchAngela Briyan Kleven, MD,as directed by  Shirlee LatchAngela Isair Inabinet, MD while in the presence of Shirlee LatchAngela Cyprian Gongaware, MD.   I, Shirlee LatchAngela Ho Parisi, MD, have reviewed all documentation for this visit. The documentation on 02/06/21 for the exam, diagnosis, procedures, and orders are all accurate and complete.   Valari Taylor, Marzella SchleinAngela M, MD, MPH [facility]

## 2021-02-07 LAB — COMPREHENSIVE METABOLIC PANEL
ALT: 17 IU/L (ref 0–32)
AST: 20 IU/L (ref 0–40)
Albumin/Globulin Ratio: 2 (ref 1.2–2.2)
Albumin: 4.4 g/dL (ref 3.6–4.6)
Alkaline Phosphatase: 55 IU/L (ref 44–121)
BUN/Creatinine Ratio: 18 (ref 12–28)
BUN: 10 mg/dL (ref 8–27)
Bilirubin Total: 0.4 mg/dL (ref 0.0–1.2)
CO2: 26 mmol/L (ref 20–29)
Calcium: 9.7 mg/dL (ref 8.7–10.3)
Chloride: 101 mmol/L (ref 96–106)
Creatinine, Ser: 0.56 mg/dL — ABNORMAL LOW (ref 0.57–1.00)
Globulin, Total: 2.2 g/dL (ref 1.5–4.5)
Glucose: 100 mg/dL — ABNORMAL HIGH (ref 65–99)
Potassium: 4.7 mmol/L (ref 3.5–5.2)
Sodium: 138 mmol/L (ref 134–144)
Total Protein: 6.6 g/dL (ref 6.0–8.5)
eGFR: 91 mL/min/{1.73_m2} (ref >59)

## 2021-02-07 LAB — LIPID PANEL
Chol/HDL Ratio: 2.2 ratio (ref 0.0–4.4)
Cholesterol, Total: 241 mg/dL — ABNORMAL HIGH (ref 100–199)
HDL: 110 mg/dL (ref >39)
LDL Chol Calc (NIH): 118 mg/dL — ABNORMAL HIGH (ref 0–99)
Triglycerides: 78 mg/dL (ref 0–149)
VLDL Cholesterol Cal: 13 mg/dL (ref 5–40)

## 2021-02-07 LAB — HEMOGLOBIN A1C
Est. average glucose Bld gHb Est-mCnc: 114 mg/dL
Hgb A1c MFr Bld: 5.6 % (ref 4.8–5.6)

## 2021-02-07 LAB — VITAMIN D 25 HYDROXY (VIT D DEFICIENCY, FRACTURES): Vit D, 25-Hydroxy: 35.3 ng/mL (ref 30.0–100.0)

## 2022-01-22 DIAGNOSIS — I10 Essential (primary) hypertension: Secondary | ICD-10-CM | POA: Diagnosis not present

## 2022-01-22 DIAGNOSIS — R110 Nausea: Secondary | ICD-10-CM | POA: Diagnosis not present

## 2022-01-22 DIAGNOSIS — R42 Dizziness and giddiness: Secondary | ICD-10-CM | POA: Diagnosis not present

## 2022-01-23 ENCOUNTER — Ambulatory Visit: Payer: Medicare Other | Admitting: Physician Assistant

## 2022-01-23 ENCOUNTER — Ambulatory Visit: Payer: Self-pay | Admitting: *Deleted

## 2022-01-23 NOTE — Telephone Encounter (Signed)
Summary: elevated bp   Patient's daughter states emt was called for patient on 7-5 for dizziness, daughter states patients systolic reading was 180 and does not know the diastolic   Patient refused to go to ed and was advised by emt to see her provider, daughter states patient continues to dial the wrong number when attempting to call provider   Daughter requesting a cb to patient to discuss bp reading and schedule appt w/ provider   Please fu w/ patient      Chief Complaint: Hypertension Symptoms: Dizziness "Spinning" severe. EMS called, BP 184/67?  Dizziness lasted few minutes. States she had called daughter on phone and "Was not speaking well and I was anxious." Declined ED with EMS, advised to call PCP. Asymptomatic since. BP today 150/71. Also reports H/O occular migraines and had one past THurs, Fri, and Sat. Frequency: Yesterday Pertinent Negatives: Patient denies headache. No symptoms presently. Disposition: [] ED /[] Urgent Care (no appt availability in office) / [x] Appointment(In office/virtual)/ []  [facility]/ [] Home Care/ [] Refused Recommended Disposition /[]  Mobile Bus/ []  Follow-up with PCP Additional Notes: Attempted to secure appt with for tomorrow afternoon, pt needs morning appt. Secured Friday. ADvised to call EMS if episode occurs again, any unilateral weakness, difficulty speaking, severe headache, difficulty walking. Pt verbalizes understanding.   Reason for Disposition  Systolic BP  >= 180 OR Diastolic >= 110  Answer Assessment - Initial Assessment Questions 1. BLOOD PRESSURE: "What is the blood pressure?" "Did you take at least two measurements 5 minutes apart?"     184/67 yesterday   today 150/71 2. ONSET: "When did you take your blood pressure?"     Yesterday EMS came  3. HOW: "How did you obtain the blood pressure?" (e.g., visiting nurse, automatic home BP monitor)     EMS and  at home today 4. HISTORY: "Do you have a history of high  blood pressure?"     Yes 5. MEDICATIONS: "Are you taking any medications for blood pressure?" "Have you missed any doses recently?"     no 6. OTHER SYMPTOMS: "Do you have any symptoms?" (e.g., headache, chest pain, blurred vision, difficulty breathing, weakness)     Dizziness, spinning yesterday. Called daughter "Couldn't speak well to daughter on phone, anxious." Lasted few minutes  Protocols used: Blood Pressure - High-A-AH

## 2022-01-23 NOTE — Telephone Encounter (Signed)
noted 

## 2022-01-24 ENCOUNTER — Ambulatory Visit (INDEPENDENT_AMBULATORY_CARE_PROVIDER_SITE_OTHER): Payer: Medicare Other | Admitting: Physician Assistant

## 2022-01-24 ENCOUNTER — Encounter: Payer: Self-pay | Admitting: Physician Assistant

## 2022-01-24 VITALS — BP 126/70 | HR 69 | Temp 97.6°F | Resp 16 | Ht 63.0 in | Wt 139.3 lb

## 2022-01-24 DIAGNOSIS — R0989 Other specified symptoms and signs involving the circulatory and respiratory systems: Secondary | ICD-10-CM

## 2022-01-24 DIAGNOSIS — I499 Cardiac arrhythmia, unspecified: Secondary | ICD-10-CM

## 2022-01-24 DIAGNOSIS — G43109 Migraine with aura, not intractable, without status migrainosus: Secondary | ICD-10-CM

## 2022-01-24 DIAGNOSIS — R030 Elevated blood-pressure reading, without diagnosis of hypertension: Secondary | ICD-10-CM | POA: Diagnosis not present

## 2022-01-24 DIAGNOSIS — R9431 Abnormal electrocardiogram [ECG] [EKG]: Secondary | ICD-10-CM

## 2022-01-24 DIAGNOSIS — I951 Orthostatic hypotension: Secondary | ICD-10-CM

## 2022-01-24 NOTE — Progress Notes (Unsigned)
I,Natalie Pena,acting as a Neurosurgeon for [employer], PA-C.,have documented all relevant documentation on the behalf of Natalie Lat, PA-C,as directed by  [employer], PA-C while in the presence of [employer], PA-C.   Established patient visit   Patient: Natalie Pena   DOB: 12-Jul-1941   81 y.o. Female  MRN: [id number] Visit Date: 01/24/2022  Today's healthcare provider: Debera Lat, PA-C   Chief Complaint  Patient presents with   Dizziness       hypertension  Subjective     Natalie Pena is a 81 yr old female presenting today for dizziness on Tuesday afternoon. Reports severe spinning sensation that lasted a few minutes. Reports only that one episode and no symptoms since. Patient sit down and called daughter who called 911. EMS took BP and was 184/67 and did EKG.  Daughter states patient was breathing heavily and very anxious. BP this morning was 157/78. Also complains of occular migraine on Thurs, Fri and Sat.  Patient takes nothing for it. Patient does not like to take medication.  Denies having sob, chest pain or palpitation, but sometimes she has a feeling that her " heart is weak."  Medications: Outpatient Medications Prior to Visit  Medication Sig   cholecalciferol (VITAMIN D) 1000 units tablet Take 1,000 Units by mouth daily.   cyanocobalamin 1000 MCG tablet Take 1,000 mcg by mouth daily.   FLAXSEED, LINSEED, PO Take 1,000 mg by mouth daily.    ibuprofen (ADVIL,MOTRIN) 200 MG tablet Take 200 mg by mouth every 6 (six) hours as needed.     loratadine (CLARITIN) 10 MG tablet Take 10 mg by mouth daily as needed for allergies.   Magnesium 250 MG TABS Take 250 mg by mouth.   Multiple Vitamin (MULTIVITAMIN) tablet Take 1 tablet by mouth daily.    No facility-administered medications prior to visit.    Review of Systems  Respiratory: Negative.    Cardiovascular: Negative.   Neurological:  Positive for dizziness.  See HPI  {Labs  Heme  Chem  Endocrine   Serology  Results Review (optional):23779}   Objective    BP 126/70 (BP Location: Right Arm, Patient Position: Sitting, Cuff Size: Normal)   Pulse 69   Temp 97.6 F (36.4 C) (Oral)   Resp 16   Ht 5\' 3"  (1.6 m)   Wt 139 lb 4.8 oz (63.2 kg)   SpO2 97%   BMI 24.68 kg/m  {Show previous vital signs (optional):23777}    No results found for any visits on 01/24/22.  Assessment & Plan     1. Irregular heart rate  - EKG 12-Lead showed Sinus  Bradycardia  Low voltage in precordial leads.  Left axis -anterior fascicular block. LVH  ? - CBC with Differential/Platelet - Comprehensive metabolic panel - Lipid panel  2. Elevated BP without diagnosis of hypertension BP 126/70 today Pt prefers not to take medications - CBC with Differential/Platelet - Comprehensive metabolic panel - Lipid panel Lifestyle modifications via low-salt diet recommended  3. Orthostatic hypotension? Lying BP 141/69 HR 62 Sitting 133/69 HR 70 Stand BP 123/66 HR 76 Possibly due to neurogenic reasons - CBC with Differential/Platelet - Comprehensive metabolic panel - Lipid panel Encourage to drink a lot of fluids BP control advised. Pt needs to keep a diary of symptoms to better detect postprandial hypotension and isolated morning OH that may be associated with supine hypertension.  Recommended non-pharmacological treatments : isometric contraction of legs for 30 seconds prior to rising from supine  state, squatting, leg crossing, inhaling deeply through pursed lips. ' Rising slowly from supine position can help prevent adverse outcome.  Compression stockings and abdominal binders can assist venous return when graded to at least 30-40 mmHg. Engaging in physical activity, eating small meals, avoiding alcohol, and increasing venous volume by taking in 2-3 liters of water and 6-10 grams of salt per day if no contraindication. If a rescue measure is needed, a patient can rapidly drink 500 mL water over 2-3  minutes to elicit a sympathetic mediated increase in blood pressure.  4. Ocular migraine Avoid migraine triggers - Ambulatory referral to Neurology  5. Bibasilar crackles  - D-Dimer, Quantitative - Pro b natriuretic peptide (BNP)[facility])  6. Abnormal EKG  - D-Dimer, Quantitative - Pro b natriuretic peptide (BNP)[facility]) - Troponin T   The patient was advised to call back or seek an in-person evaluation if the symptoms worsen or if the condition fails to improve as anticipated.  I discussed the assessment and treatment plan with the patient. The patient was provided an opportunity to ask questions and all were answered. The patient agreed with the plan and demonstrated an understanding of the instructions.  The entirety of the information documented in the History of Present Illness, Review of Systems and Physical Exam were personally obtained by me. Portions of this information were initially documented by the CMA and reviewed by me for thoroughness and accuracy.  Portions of this note were created using dictation software and may contain typographical errors.        Total encounter time more than 30 minutes  Greater than 50% was spent in counseling and coordination of care with the patient    Natalie Pena  [facility] [phone] (phone) [phone] (fax)  [facility]

## 2022-01-24 NOTE — Progress Notes (Signed)
Discussed with pt

## 2022-01-25 ENCOUNTER — Ambulatory Visit: Payer: Medicare Other | Admitting: Physician Assistant

## 2022-01-25 DIAGNOSIS — I951 Orthostatic hypotension: Secondary | ICD-10-CM | POA: Diagnosis not present

## 2022-01-25 DIAGNOSIS — I499 Cardiac arrhythmia, unspecified: Secondary | ICD-10-CM | POA: Diagnosis not present

## 2022-01-25 DIAGNOSIS — R0989 Other specified symptoms and signs involving the circulatory and respiratory systems: Secondary | ICD-10-CM | POA: Diagnosis not present

## 2022-01-25 DIAGNOSIS — E7800 Pure hypercholesterolemia, unspecified: Secondary | ICD-10-CM | POA: Diagnosis not present

## 2022-01-25 DIAGNOSIS — R7309 Other abnormal glucose: Secondary | ICD-10-CM | POA: Diagnosis not present

## 2022-01-25 DIAGNOSIS — I509 Heart failure, unspecified: Secondary | ICD-10-CM | POA: Diagnosis not present

## 2022-01-25 DIAGNOSIS — Z13228 Encounter for screening for other metabolic disorders: Secondary | ICD-10-CM | POA: Diagnosis not present

## 2022-01-25 DIAGNOSIS — R9431 Abnormal electrocardiogram [ECG] [EKG]: Secondary | ICD-10-CM | POA: Diagnosis not present

## 2022-01-25 DIAGNOSIS — R030 Elevated blood-pressure reading, without diagnosis of hypertension: Secondary | ICD-10-CM | POA: Diagnosis not present

## 2022-01-25 DIAGNOSIS — Z13220 Encounter for screening for lipoid disorders: Secondary | ICD-10-CM | POA: Diagnosis not present

## 2022-01-25 NOTE — Progress Notes (Incomplete)
I,Natalie Pena,acting as a Neurosurgeon for [employer], PA-C.,have documented all relevant documentation on the behalf of Natalie Lat, PA-C,as directed by  [employer], PA-C while in the presence of [employer], PA-C.   Established patient visit   Patient: Natalie Pena   DOB: April 29, 1941   81 y.o. Female  MRN: [id number] Visit Date: 01/24/2022  Today's healthcare provider: Debera Lat, PA-C   Chief Complaint  Patient presents with  . Dizziness       hypertension  Subjective     Natalie Pena is a 81 yr old female presenting today for dizziness on Tuesday afternoon. Reports severe spinning sensation that lasted a few minutes. Reports only that one episode and no symptoms since. Patient sit down and called daughter who called 911. EMS took BP and was 184/67 and did EKG.  Daughter states patient was breathing heavily and very anxious. BP this morning was 157/78. Also complains of occular migraine on Thurs, Fri and Sat.  Patient takes nothing for it. Patient does not like to take medication.  Denies having sob, chest pain or palpitation, but sometimes she has a feeling that her " heart is weak."  Medications: Outpatient Medications Prior to Visit  Medication Sig  . cholecalciferol (VITAMIN D) 1000 units tablet Take 1,000 Units by mouth daily.  . cyanocobalamin 1000 MCG tablet Take 1,000 mcg by mouth daily.  Marland Kitchen FLAXSEED, LINSEED, PO Take 1,000 mg by mouth daily.   Marland Kitchen ibuprofen (ADVIL,MOTRIN) 200 MG tablet Take 200 mg by mouth every 6 (six) hours as needed.    . loratadine (CLARITIN) 10 MG tablet Take 10 mg by mouth daily as needed for allergies.  . Magnesium 250 MG TABS Take 250 mg by mouth.  . Multiple Vitamin (MULTIVITAMIN) tablet Take 1 tablet by mouth daily.    No facility-administered medications prior to visit.    Review of Systems  Respiratory: Negative.    Cardiovascular: Negative.   Neurological:  Positive for dizziness.  See HPI  {Labs  Heme  Chem   Endocrine  Serology  Results Review (optional):23779}   Objective    BP 126/70 (BP Location: Right Arm, Patient Position: Sitting, Cuff Size: Normal)   Pulse 69   Temp 97.6 F (36.4 C) (Oral)   Resp 16   Ht 5\' 3"  (1.6 m)   Wt 139 lb 4.8 oz (63.2 kg)   SpO2 97%   BMI 24.68 kg/m  {Show previous vital signs (optional):23777}  Physical Exam Vitals reviewed.  Constitutional:      General: She is in acute distress.     Appearance: Normal appearance. She is normal weight.  HENT:     Head: Normocephalic and atraumatic.  Eyes:     Extraocular Movements: Extraocular movements intact.     Pupils: Pupils are equal, round, and reactive to light.  Cardiovascular:     Rate and Rhythm: Normal rate and regular rhythm.     Pulses: Normal pulses.  Pulmonary:     Effort: Pulmonary effort is normal.     Breath sounds: Rales present.  Musculoskeletal:        General: Normal range of motion.     Right lower leg: No edema.  Skin:    General: Skin is warm.     Capillary Refill: Capillary refill takes 2 to 3 seconds.  Neurological:     Mental Status: She is alert and oriented to person, place, and time. Mental status is at baseline.  Psychiatric:  Behavior: Behavior normal.        Thought Content: Thought content normal.        Judgment: Judgment normal.      No results found for any visits on 01/24/22.  Assessment & Plan     1. Irregular heart rate  - EKG 12-Lead showed Sinus  Bradycardia  Low voltage in precordial leads.  Left axis -anterior fascicular block. LVH  ? - CBC with Differential/Platelet - Comprehensive metabolic panel - Lipid panel  2. Elevated BP without diagnosis of hypertension BP 126/70 today Pt prefers not to take medications - CBC with Differential/Platelet - Comprehensive metabolic panel - Lipid panel Lifestyle modifications via low-salt diet recommended  3. Orthostatic hypotension? Lying BP 141/69 HR 62 Sitting 133/69 HR 70 Stand BP 123/66 HR  76 Possibly due to neurogenic reasons - CBC with Differential/Platelet - Comprehensive metabolic panel - Lipid panel Encourage to drink a lot of fluids BP control advised. Pt needs to keep a diary of symptoms to better detect postprandial hypotension and isolated morning OH that may be associated with supine hypertension.  Recommended non-pharmacological treatments : isometric contraction of legs for 30 seconds prior to rising from supine state, squatting, leg crossing, inhaling deeply through pursed lips. ' Rising slowly from supine position can help prevent adverse outcome.  Compression stockings and abdominal binders can assist venous return when graded to at least 30-40 mmHg. Engaging in physical activity, eating small meals, avoiding alcohol, and increasing venous volume by taking in 2-3 liters of water and 6-10 grams of salt per day if no contraindication. If a rescue measure is needed, a patient can rapidly drink 500 mL water over 2-3 minutes to elicit a sympathetic mediated increase in blood pressure.  4. Ocular migraine Avoid migraine triggers - Ambulatory referral to Neurology  5. Bibasilar crackles  - D-Dimer, Quantitative - Pro b natriuretic peptide (BNP)[facility] [place] [facility])  6. Abnormal EKG  - D-Dimer, Quantitative - Pro b natriuretic peptide (BNP)[facility] [facility]) - Troponin T   The patient was advised to call back or seek an in-person evaluation if the symptoms worsen or if the condition fails to improve as anticipated.  I discussed the assessment and treatment plan with the patient. The patient was provided an opportunity to ask questions and all were answered. The patient agreed with the plan and demonstrated an understanding of the instructions.  The entirety of the information documented in the History of Present Illness, Review of Systems and Physical Exam were personally obtained by me. Portions of this information were initially  documented by the CMA and reviewed by me for thoroughness and accuracy.  Portions of this note were created using dictation software and may contain typographical errors.        Total encounter time more than 30 minutes  Greater than 50% was spent in counseling and coordination of care with the patient    Cherlynn Polo  [facility] [phone] (phone) [phone] (fax)  [facility]

## 2022-01-27 LAB — COMPREHENSIVE METABOLIC PANEL
ALT: 20 IU/L (ref 0–32)
AST: 18 IU/L (ref 0–40)
Albumin/Globulin Ratio: 1.9 (ref 1.2–2.2)
Albumin: 4.5 g/dL (ref 3.6–4.6)
Alkaline Phosphatase: 58 IU/L (ref 44–121)
BUN/Creatinine Ratio: 18 (ref 12–28)
BUN: 12 mg/dL (ref 8–27)
Bilirubin Total: 0.5 mg/dL (ref 0.0–1.2)
CO2: 22 mmol/L (ref 20–29)
Calcium: 9.7 mg/dL (ref 8.7–10.3)
Chloride: 100 mmol/L (ref 96–106)
Creatinine, Ser: 0.65 mg/dL (ref 0.57–1.00)
Globulin, Total: 2.4 g/dL (ref 1.5–4.5)
Glucose: 105 mg/dL — ABNORMAL HIGH (ref 70–99)
Potassium: 4.3 mmol/L (ref 3.5–5.2)
Sodium: 138 mmol/L (ref 134–144)
Total Protein: 6.9 g/dL (ref 6.0–8.5)
eGFR: 88 mL/min/{1.73_m2} (ref >59)

## 2022-01-27 LAB — PRO B NATRIURETIC PEPTIDE: NT-Pro BNP: 161 pg/mL (ref 0–738)

## 2022-01-27 LAB — CBC WITH DIFFERENTIAL/PLATELET
Basophils Absolute: 0.1 10*3/uL (ref 0.0–0.2)
Basos: 1 %
EOS (ABSOLUTE): 0.2 10*3/uL (ref 0.0–0.4)
Eos: 3 %
Hematocrit: 42.5 % (ref 34.0–46.6)
Hemoglobin: 14.3 g/dL (ref 11.1–15.9)
Immature Grans (Abs): 0 10*3/uL (ref 0.0–0.1)
Immature Granulocytes: 0 %
Lymphocytes Absolute: 2.3 10*3/uL (ref 0.7–3.1)
Lymphs: 32 %
MCH: 29.2 pg (ref 26.6–33.0)
MCHC: 33.6 g/dL (ref 31.5–35.7)
MCV: 87 fL (ref 79–97)
Monocytes Absolute: 0.6 10*3/uL (ref 0.1–0.9)
Monocytes: 9 %
Neutrophils Absolute: 3.9 10*3/uL (ref 1.4–7.0)
Neutrophils: 55 %
Platelets: 270 10*3/uL (ref 150–450)
RBC: 4.89 x10E6/uL (ref 3.77–5.28)
RDW: 13.3 % (ref 11.7–15.4)
WBC: 7 10*3/uL (ref 3.4–10.8)

## 2022-01-27 LAB — D-DIMER, QUANTITATIVE: D-DIMER: 0.33 mg/L FEU (ref 0.00–0.49)

## 2022-01-27 LAB — LIPID PANEL
Chol/HDL Ratio: 2.2 ratio (ref 0.0–4.4)
Cholesterol, Total: 238 mg/dL — ABNORMAL HIGH (ref 100–199)
HDL: 109 mg/dL (ref >39)
LDL Chol Calc (NIH): 116 mg/dL — ABNORMAL HIGH (ref 0–99)
Triglycerides: 77 mg/dL (ref 0–149)
VLDL Cholesterol Cal: 13 mg/dL (ref 5–40)

## 2022-01-27 LAB — TROPONIN T: Troponin T (Highly Sensitive): 10 ng/L (ref 0–14)

## 2022-01-29 NOTE — Progress Notes (Signed)
Hello Natalie Pena ,   Your labwork results all are within normal limits/stable for you. We do recommend continuing with healthy diet and regular exercise/walks  Any questions please reach out to the office or message me on MyChart!  Best, Debera Lat, PA-C

## 2022-02-07 ENCOUNTER — Encounter: Payer: Self-pay | Admitting: Family Medicine

## 2022-02-07 NOTE — Progress Notes (Signed)
I,Markeis Allman Robinson,acting as a Neurosurgeon for [employer], PA-C.,have documented all relevant documentation on the behalf of Debera Lat, PA-C,as directed by  [employer], PA-C while in the presence of [employer], PA-C.  Established patient visit   Patient: Natalie Pena   DOB: Oct 06, 1940   81 y.o. Female  MRN: [id number] Visit Date: 02/08/2022  Today's healthcare provider: Debera Lat, PA-C   CC: follow-up for chronic conditions  Subjective    Follow up for orthostatic hypotension   The patient was last seen for this 2 weeks ago. Changes made at last visit include: Encourage to drink a lot of fluids BP control advised. Pt needs to keep a diary of symptoms to better detect postprandial hypotension and isolated morning OH that may be associated with supine hypertension.  isometric contraction of legs for 30 seconds prior to rising from supine state, squatting, leg crossing, inhaling deeply through pursed lips. ' Rising slowly from supine position can help prevent adverse outcome.  Compression stockings and abdominal binders can assist venous return when graded to at least 30-40 mmHg. Engaging in physical activity, eating small meals, avoiding alcohol, and increasing venous volume by taking in 2-3 liters of water and 6-10 grams of salt per day if no contraindication. If a rescue measure is needed, a patient can rapidly drink 500 mL water over 2-3 minutes to elicit a sympathetic mediated increase in blood pressure. Patient has been checking BP and has log with her at visit.   She reports good compliance with treatment.  Patient reports fall on 02-05-22 and patient is bruised. Left shoulder and arm is painful with extension and  with overhead activities. Had hip replacement surgery in 2012.  Patient has appt with neurology 02/11/22. -----------------------------------------------------------------------------------------   Medications: Outpatient Medications Prior to Visit   Medication Sig   cholecalciferol (VITAMIN D) 1000 units tablet Take 1,000 Units by mouth daily.   cyanocobalamin 1000 MCG tablet Take 1,000 mcg by mouth daily.   FLAXSEED, LINSEED, PO Take 1,000 mg by mouth daily.    ibuprofen (ADVIL,MOTRIN) 200 MG tablet Take 200 mg by mouth every 6 (six) hours as needed.     loratadine (CLARITIN) 10 MG tablet Take 10 mg by mouth daily as needed for allergies.   Magnesium 250 MG TABS Take 250 mg by mouth.   Multiple Vitamin (MULTIVITAMIN) tablet Take 1 tablet by mouth daily.    No facility-administered medications prior to visit.    Review of Systems  Constitutional:  Positive for activity change. Negative for chills and fever.  Respiratory: Negative.    Cardiovascular: Negative.   Gastrointestinal: Negative.   Psychiatric/Behavioral:  Positive for agitation.    Except see HPI    Objective    BP 124/72 (BP Location: Right Arm, Patient Position: Sitting, Cuff Size: Normal)   Pulse 83   Temp 98.2 F (36.8 C) (Oral)   Resp 16   Wt 138 lb (62.6 kg)   SpO2 98%   BMI 24.45 kg/m    Physical Exam Vitals reviewed.  Constitutional:      General: She is not in acute distress.    Appearance: Normal appearance. She is well-developed. She is not diaphoretic.  HENT:     Head: Normocephalic and atraumatic.  Eyes:     General: No scleral icterus.       Right eye: No discharge.        Left eye: No discharge.     Extraocular Movements: Extraocular movements intact.  Conjunctiva/sclera: Conjunctivae normal.     Pupils: Pupils are equal, round, and reactive to light.  Neck:     Thyroid: No thyromegaly.  Cardiovascular:     Rate and Rhythm: Normal rate. Rhythm irregular.     Pulses: Normal pulses.     Heart sounds: Normal heart sounds. No murmur heard. Pulmonary:     Effort: Pulmonary effort is normal. No respiratory distress.     Breath sounds: Normal breath sounds. No wheezing, rhonchi or rales.  Abdominal:     General: Abdomen is flat.  Bowel sounds are normal.     Palpations: Abdomen is soft.  Musculoskeletal:        General: Swelling and tenderness (L hip, L shoulder with ovehead movements and extension) present.     Cervical back: Neck supple.     Right lower leg: No edema.     Left lower leg: No edema.  Lymphadenopathy:     Cervical: No cervical adenopathy.  Skin:    General: Skin is warm and dry.     Capillary Refill: Capillary refill takes less than 2 seconds.     Findings: Bruising (over the L hip) present. No rash.  Neurological:     General: No focal deficit present.     Mental Status: She is alert and oriented to person, place, and time. Mental status is at baseline.     Cranial Nerves: No cranial nerve deficit.     Sensory: No sensory deficit.     Coordination: Coordination normal.     Gait: Gait normal.     Deep Tendon Reflexes: Reflexes normal.  Psychiatric:        Mood and Affect: Mood normal.        Behavior: Behavior normal.     No results found for any visits on 02/08/22.  Assessment & Plan     Irregular heart rate Elevated BP without diagnosis of hypertension Orthostatic hypotension Bibasilar crackles Abnormal EKG BP today 124/72, at home ranges between 119/59 to 164/70 Pt will change her BP cuff and continue to measure her BP at home We will check her BP record at her next appt - Might need to see Cardiology for evaluation  Ocular migraine No new episodes  Will see Neurology on 02/11/22  Fall, initial encounter Left shoulder pain, left hip pain and bruises Had hip replacement surgery in 2012 - AMB referral to orthopedics   FU in 6 weeks for chronic conditions     The patient was advised to call back or seek an in-person evaluation if the symptoms worsen or if the condition fails to improve as anticipated.  I discussed the assessment and treatment plan with the patient. The patient was provided an opportunity to ask questions and all were answered. The patient agreed with the  plan and demonstrated an understanding of the instructions.  The entirety of the information documented in the History of Present Illness, Review of Systems and Physical Exam were personally obtained by me. Portions of this information were initially documented by the CMA and reviewed by me for thoroughness and accuracy.  Portions of this note were created using dictation software and may contain typographical errors.    Debera Lat, PA-C  [facility] [phone] (phone) [phone] (fax)  [facility]

## 2022-02-08 ENCOUNTER — Encounter: Payer: Self-pay | Admitting: Physician Assistant

## 2022-02-08 ENCOUNTER — Ambulatory Visit (INDEPENDENT_AMBULATORY_CARE_PROVIDER_SITE_OTHER): Payer: Medicare Other | Admitting: Physician Assistant

## 2022-02-08 ENCOUNTER — Telehealth: Payer: Self-pay

## 2022-02-08 VITALS — BP 124/72 | HR 83 | Temp 98.2°F | Resp 16 | Wt 138.0 lb

## 2022-02-08 DIAGNOSIS — I951 Orthostatic hypotension: Secondary | ICD-10-CM

## 2022-02-08 DIAGNOSIS — R0989 Other specified symptoms and signs involving the circulatory and respiratory systems: Secondary | ICD-10-CM | POA: Diagnosis not present

## 2022-02-08 DIAGNOSIS — R9431 Abnormal electrocardiogram [ECG] [EKG]: Secondary | ICD-10-CM

## 2022-02-08 DIAGNOSIS — R030 Elevated blood-pressure reading, without diagnosis of hypertension: Secondary | ICD-10-CM

## 2022-02-08 DIAGNOSIS — G43109 Migraine with aura, not intractable, without status migrainosus: Secondary | ICD-10-CM | POA: Diagnosis not present

## 2022-02-08 DIAGNOSIS — W19XXXA Unspecified fall, initial encounter: Secondary | ICD-10-CM | POA: Diagnosis not present

## 2022-02-08 DIAGNOSIS — I499 Cardiac arrhythmia, unspecified: Secondary | ICD-10-CM

## 2022-02-08 NOTE — Telephone Encounter (Signed)
Copied from CRM [id number]. Topic: General - Other >> Feb 08, 2022 11:32 AM Esperanza Sheets wrote: Patient making provider Edmon Crape) aware she was not able to make it to [facility] today and will go the beginning of next week  Patient also inquiring if staff is going to make an appt for her w/ the heart doctor or should she make it  Pease assist patient further

## 2022-02-11 DIAGNOSIS — G43109 Migraine with aura, not intractable, without status migrainosus: Secondary | ICD-10-CM | POA: Diagnosis not present

## 2022-02-11 DIAGNOSIS — R42 Dizziness and giddiness: Secondary | ICD-10-CM | POA: Diagnosis not present

## 2022-02-11 NOTE — Telephone Encounter (Signed)
Pt states she has not seen cardiology / Dr. Antonieta Iba since 2012 so I let her know we will send in referral for her and to listen out for coordinator.

## 2022-02-12 ENCOUNTER — Other Ambulatory Visit: Payer: Self-pay | Admitting: Physician Assistant

## 2022-02-12 ENCOUNTER — Other Ambulatory Visit: Payer: Self-pay

## 2022-02-12 DIAGNOSIS — S7002XA Contusion of left hip, initial encounter: Secondary | ICD-10-CM | POA: Diagnosis not present

## 2022-02-12 DIAGNOSIS — R42 Dizziness and giddiness: Secondary | ICD-10-CM

## 2022-02-12 DIAGNOSIS — G43109 Migraine with aura, not intractable, without status migrainosus: Secondary | ICD-10-CM

## 2022-02-12 DIAGNOSIS — R9431 Abnormal electrocardiogram [ECG] [EKG]: Secondary | ICD-10-CM

## 2022-02-12 DIAGNOSIS — M25512 Pain in left shoulder: Secondary | ICD-10-CM | POA: Diagnosis not present

## 2022-02-12 NOTE — Telephone Encounter (Signed)
Pt informed

## 2022-02-12 NOTE — Telephone Encounter (Signed)
Please, let pt know that I placed her referral to cardiology.

## 2022-02-20 ENCOUNTER — Ambulatory Visit
Admission: RE | Admit: 2022-02-20 | Discharge: 2022-02-20 | Disposition: A | Discharge status: Home / Self Care | Payer: Medicare Other | Source: Ambulatory Visit | Attending: Physician Assistant | Admitting: Physician Assistant

## 2022-02-20 DIAGNOSIS — R42 Dizziness and giddiness: Secondary | ICD-10-CM | POA: Diagnosis not present

## 2022-02-20 DIAGNOSIS — G43109 Migraine with aura, not intractable, without status migrainosus: Secondary | ICD-10-CM | POA: Diagnosis not present

## 2022-02-20 DIAGNOSIS — I672 Cerebral atherosclerosis: Secondary | ICD-10-CM | POA: Diagnosis not present

## 2022-02-20 DIAGNOSIS — Z043 Encounter for examination and observation following other accident: Secondary | ICD-10-CM | POA: Diagnosis not present

## 2022-02-27 ENCOUNTER — Ambulatory Visit (INDEPENDENT_AMBULATORY_CARE_PROVIDER_SITE_OTHER): Payer: Medicare Other

## 2022-02-27 VITALS — Wt 138.0 lb

## 2022-02-27 DIAGNOSIS — Z0000 Encounter for general adult medical examination without abnormal findings: Secondary | ICD-10-CM

## 2022-02-27 NOTE — Patient Instructions (Signed)
[name] , Thank you for taking time to come for your Medicare Wellness Visit. I appreciate your ongoing commitment to your health goals. Please review the following plan we discussed and let me know if I can assist you in the future.   Screening recommendations/referrals: Colonoscopy: aged out Mammogram: aged out Bone Density: declined referral Recommended yearly ophthalmology/optometry visit for glaucoma screening and checkup Recommended yearly dental visit for hygiene and checkup  Vaccinations: Influenza vaccine: 05/07/21 Pneumococcal vaccine: 12/29/13 Tdap vaccine: 08/09/10, due if have injury Shingles vaccine: n/d   Covid-19:08/25/19, 09/15/19, 06/06/20, 02/13/21  Advanced directives: yes  Conditions/risks identified: none  Next appointment: Follow up in one year for your annual wellness visit 03/03/23 @ 10:15 am by phone   Preventive Care 65 Years and Older, Female Preventive care refers to lifestyle choices and visits with your health care provider that can promote health and wellness. What does preventive care include? A yearly physical exam. This is also called an annual well check. Dental exams once or twice a year. Routine eye exams. Ask your health care provider how often you should have your eyes checked. Personal lifestyle choices, including: Daily care of your teeth and gums. Regular physical activity. Eating a healthy diet. Avoiding tobacco and drug use. Limiting alcohol use. Practicing safe sex. Taking low-dose aspirin every day. Taking vitamin and mineral supplements as recommended by your health care provider. What happens during an annual well check? The services and screenings done by your health care provider during your annual well check will depend on your age, overall health, lifestyle risk factors, and family history of disease. Counseling  Your health care provider may ask you questions about your: Alcohol use. Tobacco use. Drug use. Emotional  well-being. Home and relationship well-being. Sexual activity. Eating habits. History of falls. Memory and ability to understand (cognition). Work and work Astronomer. Reproductive health. Screening  You may have the following tests or measurements: Height, weight, and BMI. Blood pressure. Lipid and cholesterol levels. These may be checked every 5 years, or more frequently if you are over 33 years old. Skin check. Lung cancer screening. You may have this screening every year starting at age 71 if you have a 30-pack-year history of smoking and currently smoke or have quit within the past 15 years. Fecal occult blood test (FOBT) of the stool. You may have this test every year starting at age 61. Flexible sigmoidoscopy or colonoscopy. You may have a sigmoidoscopy every 5 years or a colonoscopy every 10 years starting at age 88. Hepatitis C blood test. Hepatitis B blood test. Sexually transmitted disease (STD) testing. Diabetes screening. This is done by checking your blood sugar (glucose) after you have not eaten for a while (fasting). You may have this done every 1-3 years. Bone density scan. This is done to screen for osteoporosis. You may have this done starting at age 9. Mammogram. This may be done every 1-2 years. Talk to your health care provider about how often you should have regular mammograms. Talk with your health care provider about your test results, treatment options, and if necessary, the need for more tests. Vaccines  Your health care provider may recommend certain vaccines, such as: Influenza vaccine. This is recommended every year. Tetanus, diphtheria, and acellular pertussis (Tdap, Td) vaccine. You may need a Td booster every 10 years. Zoster vaccine. You may need this after age 18. Pneumococcal 13-valent conjugate (PCV13) vaccine. One dose is recommended after age 18. Pneumococcal polysaccharide (PPSV23) vaccine. One dose is recommended  after age 32. Talk to your  health care provider about which screenings and vaccines you need and how often you need them. This information is not intended to replace advice given to you by your health care provider. Make sure you discuss any questions you have with your health care provider. Document Released: 08/04/2015 Document Revised: 03/27/2016 Document Reviewed: 05/09/2015 Elsevier Interactive Patient Education  2017 [place] Prevention in the Home Falls can cause injuries. They can happen to people of all ages. There are many things you can do to make your home safe and to help prevent falls. What can I do on the outside of my home? Regularly fix the edges of walkways and driveways and fix any cracks. Remove anything that might make you trip as you walk through a door, such as a raised step or threshold. Trim any bushes or trees on the path to your home. Use bright outdoor lighting. Clear any walking paths of anything that might make someone trip, such as rocks or tools. Regularly check to see if handrails are loose or broken. Make sure that both sides of any steps have handrails. Any raised decks and porches should have guardrails on the edges. Have any leaves, snow, or ice cleared regularly. Use sand or salt on walking paths during winter. Clean up any spills in your garage right away. This includes oil or grease spills. What can I do in the bathroom? Use night lights. Install grab bars by the toilet and in the tub and shower. Do not use towel bars as grab bars. Use non-skid mats or decals in the tub or shower. If you need to sit down in the shower, use a plastic, non-slip stool. Keep the floor dry. Clean up any water that spills on the floor as soon as it happens. Remove soap buildup in the tub or shower regularly. Attach bath mats securely with double-sided non-slip rug tape. Do not have throw rugs and other things on the floor that can make you trip. What can I do in the bedroom? Use night  lights. Make sure that you have a light by your bed that is easy to reach. Do not use any sheets or blankets that are too big for your bed. They should not hang down onto the floor. Have a firm chair that has side arms. You can use this for support while you get dressed. Do not have throw rugs and other things on the floor that can make you trip. What can I do in the kitchen? Clean up any spills right away. Avoid walking on wet floors. Keep items that you use a lot in easy-to-reach places. If you need to reach something above you, use a strong step stool that has a grab bar. Keep electrical cords out of the way. Do not use floor polish or wax that makes floors slippery. If you must use wax, use non-skid floor wax. Do not have throw rugs and other things on the floor that can make you trip. What can I do with my stairs? Do not leave any items on the stairs. Make sure that there are handrails on both sides of the stairs and use them. Fix handrails that are broken or loose. Make sure that handrails are as long as the stairways. Check any carpeting to make sure that it is firmly attached to the stairs. Fix any carpet that is loose or worn. Avoid having throw rugs at the top or bottom of the stairs. If you  do have throw rugs, attach them to the floor with carpet tape. Make sure that you have a light switch at the top of the stairs and the bottom of the stairs. If you do not have them, ask someone to add them for you. What else can I do to help prevent falls? Wear shoes that: Do not have high heels. Have rubber bottoms. Are comfortable and fit you well. Are closed at the toe. Do not wear sandals. If you use a stepladder: Make sure that it is fully opened. Do not climb a closed stepladder. Make sure that both sides of the stepladder are locked into place. Ask someone to hold it for you, if possible. Clearly mark and make sure that you can see: Any grab bars or handrails. First and last  steps. Where the edge of each step is. Use tools that help you move around (mobility aids) if they are needed. These include: Canes. Walkers. Scooters. Crutches. Turn on the lights when you go into a dark area. Replace any light bulbs as soon as they burn out. Set up your furniture so you have a clear path. Avoid moving your furniture around. If any of your floors are uneven, fix them. If there are any pets around you, be aware of where they are. Review your medicines with your doctor. Some medicines can make you feel dizzy. This can increase your chance of falling. Ask your doctor what other things that you can do to help prevent falls. This information is not intended to replace advice given to you by your health care provider. Make sure you discuss any questions you have with your health care provider. Document Released: 05/04/2009 Document Revised: 12/14/2015 Document Reviewed: 08/12/2014 Elsevier Interactive Patient Education  2017 [employer].

## 2022-02-27 NOTE — Progress Notes (Signed)
Virtual Visit via Telephone Note  I connected with  Natalie Pena on 02/27/22 at 10:00 AM EDT by telephone and verified that I am speaking with the correct person using two identifiers.  Location: Patient: home Provider: [facility] Persons participating in the virtual visit: patient/Nurse Health Advisor   I discussed the limitations, risks, security and privacy concerns of performing an evaluation and management service by telephone and the availability of in person appointments. The patient expressed understanding and agreed to proceed.  Interactive audio and video telecommunications were attempted between this nurse and patient, however failed, due to patient having technical difficulties OR patient did not have access to video capability.  We continued and completed visit with audio only.  Some vital signs may be absent or patient reported.   Hal Hope, LPN  Subjective:   Natalie Pena is a 81 y.o. female who presents for Medicare Annual (Subsequent) preventive examination.  Review of Systems     Cardiac Risk Factors include: advanced age (>32men, >104 women)     Objective:    There were no vitals filed for this visit. There is no height or weight on file to calculate BMI.     02/27/2022   10:04 AM 03/13/2018    8:46 AM 03/12/2017    2:36 PM 03/13/2016    8:51 AM 01/25/2015    9:16 AM  Advanced Directives  Does Patient Have a Medical Advance Directive? Yes Yes No Yes Yes  Type of Estate agent of [place];Living will Healthcare Power of [place];Living will  Living will Living will  Does patient want to make changes to medical advance directive? Yes (Inpatient - patient defers changing a medical advance directive and declines information at this time)  No - Patient declined    Copy of Healthcare Power of Attorney in Chart? Yes - validated most recent copy scanned in chart (See row information) Yes       Current Medications (verified) Outpatient  Encounter Medications as of 02/27/2022  Medication Sig   cholecalciferol (VITAMIN D) 1000 units tablet Take 1,000 Units by mouth daily.   Cholecalciferol (VITAMIN D3) 10 MCG (400 UNIT) tablet Take by mouth.   cyanocobalamin 1000 MCG tablet Take 1,000 mcg by mouth daily.   FLAXSEED, LINSEED, PO Take 1,000 mg by mouth daily.    ibuprofen (ADVIL,MOTRIN) 200 MG tablet Take 200 mg by mouth every 6 (six) hours as needed.     loratadine (CLARITIN) 10 MG tablet Take 10 mg by mouth daily as needed for allergies.   Magnesium 250 MG TABS Take 250 mg by mouth.   Multiple Vitamin (MULTIVITAMIN) tablet Take 1 tablet by mouth daily.    No facility-administered encounter medications on file as of 02/27/2022.    Allergies (verified) Shellfish allergy   History: Past Medical History:  Diagnosis Date   Hypercholesterolemia    Osteoporosis    Past Surgical History:  Procedure Laterality Date   APPENDECTOMY     BILATERAL ANTERIOR TOTAL HIP ARTHROPLASTY     CESAREAN SECTION  1973   EYE SURGERY Left 05/2014   cataract surgery   TONSILLECTOMY AND ADENOIDECTOMY  1940   TOTAL ABDOMINAL HYSTERECTOMY  1974   fibroids   unilateral oophorectomy  1974   left   Family History  Problem Relation Age of Onset   Pancreatic cancer Father    Heart attack Mother    Breast cancer Paternal Grandmother 28   Stroke Sister    Social History   Socioeconomic  History   Marital status: Widowed    Spouse name: Not on file   Number of children: 2   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    Social Determinants of Health   Financial Resource Strain: Low Risk  (02/27/2022)   Overall Financial Resource Strain (CARDIA)    Difficulty of Paying Living Expenses: Not hard at all  Food Insecurity: No Food Insecurity (02/27/2022)   Hunger Vital Sign    Worried About Running Out of Food in the Last Year: Never true    Ran Out of Food in the Last Year: Never true  Transportation Needs: No Transportation Needs (02/27/2022)   PRAPARE - Administrator, Civil Service (Medical): No    Lack of Transportation (Non-Medical): No  Physical Activity: Insufficiently Active (02/27/2022)   Exercise Vital Sign    Days of Exercise per Week: 7 days    Minutes of Exercise per Session: 20 min  Stress: No Stress Concern Present (02/27/2022)   [employer] of Occupational Health - Occupational Stress Questionnaire    Feeling of Stress : Not at all  Social Connections: Moderately Isolated (02/27/2022)   Social Connection and Isolation Panel [NHANES]    Frequency of Communication with Friends and Family: More than three times a week    Frequency of Social Gatherings with Friends and Family: Twice a week    Attends Religious Services: More than 4 times per year    Active Member of [employer] or Organizations: No    Attends Banker Meetings: Never    Marital Status: Widowed    Tobacco Counseling Counseling given: Not Answered Tobacco comments: tobacco use - no   Clinical Intake:  Pre-visit preparation completed: Yes  Pain : No/denies pain     Nutritional Risks: None Diabetes: No  How often do you need to have someone help you when you read instructions, pamphlets, or other written materials from your doctor or pharmacy?: 1 - Never  Diabetic?no  Interpreter Needed?: No  Information entered by :: Kennedy Bucker, LPN   Activities of Daily Living    02/27/2022   10:06 AM 01/24/2022   11:02 AM  In your present state of  health, do you have any difficulty performing the following activities:  Hearing? 0 0  Vision? 0 0  Difficulty concentrating or making decisions? 0 0  Walking or climbing stairs? 0 0  Dressing or bathing? 0 0  Doing errands, shopping? 0 0  Preparing Food and eating ? N   Using the Toilet? N   In the past six months, have you accidently leaked urine? Y   Do you have problems with loss of bowel control? N   Managing your Medications? N   Managing your Finances? N   Housekeeping or managing your Housekeeping? N     Patient Care Team: Erasmo Downer, MD as PCP - General (Family Medicine) Lockie Mola, MD as  Referring Physician (Ophthalmology)  Indicate any recent Medical Services you may have received from other than [facility] providers in the past year (date may be approximate).     Assessment:   This is a routine wellness examination for Anju.  Hearing/Vision screen Hearing Screening - Comments:: No aids Vision Screening - Comments:: Wears glasses- [facility]  Dietary issues and exercise activities discussed: Current Exercise Habits: Home exercise routine, Type of exercise: walking, Time (Minutes): 15, Frequency (Times/Week): 7, Weekly Exercise (Minutes/Week): 105, Intensity: Mild   Goals Addressed             This Visit's Progress    DIET - EAT MORE FRUITS AND VEGETABLES         Depression Screen    02/27/2022   10:02 AM 01/24/2022   11:01 AM 02/06/2021    9:52 AM 03/13/2018    8:46 AM 03/12/2017    2:38 PM 03/13/2016    9:12 AM 01/25/2015    9:24 AM  PHQ 2/9 Scores  PHQ - 2 Score 0 0 0 0 0 0 0  PHQ- 9 Score 0 1 3        Fall Risk    02/27/2022   10:05 AM 01/24/2022   11:01 AM 02/06/2021    9:53 AM 03/13/2018    8:46 AM 03/12/2017    2:38 PM  Fall Risk   Falls in the past year? 1 0 0 No No  Number falls in past yr: 1 0 0    Injury with Fall? 0 0 0    Risk for fall due to : History of fall(s)      Follow up Falls evaluation completed;Falls prevention  discussed Falls evaluation completed       FALL RISK PREVENTION PERTAINING TO THE HOME:  Any stairs in or around the home? No  If so, are there any without handrails? Yes  Home free of loose throw rugs in walkways, pet beds, electrical cords, etc? Yes  Adequate lighting in your home to reduce risk of falls? Yes   ASSISTIVE DEVICES UTILIZED TO PREVENT FALLS:  Life alert? No  Use of a cane, walker or w/c? No  Grab bars in the bathroom? Yes  Shower chair or bench in shower? No  Elevated toilet seat or a handicapped toilet? Yes    Cognitive Function:        02/27/2022   10:08 AM 03/13/2018    8:51 AM  6CIT Screen  What Year? 0 points 0 points  What month? 0 points 0 points  What time? 0 points 0 points  Count back from 20 0 points 0 points  Months in reverse 0 points 0 points  Repeat phrase 2 points 2 points  Total Score 2 points 2 points    Immunizations Immunization History  Administered Date(s) Administered   Influenza, High Dose Seasonal PF 04/11/2017   Influenza, Seasonal, Injecte, Preservative Fre 03/30/2019   Influenza-Unspecified 05/07/2021   PFIZER Comirnaty(Gray Top)Covid-19 Tri-Sucrose Vaccine 08/25/2019, 09/15/2019, 06/06/2020   Pneumococcal Conjugate-13 12/29/2013   Pneumococcal Polysaccharide-23 07/22/2005   Tdap 08/09/2010    TDAP status: Due, Education has been provided regarding the importance of this vaccine. Advised may receive this vaccine at local pharmacy or Health Dept. Aware to provide a copy of the vaccination record if obtained from local pharmacy or Health Dept. Verbalized acceptance and understanding.  Flu Vaccine status: Up to date  Pneumococcal vaccine status: Up to date  Covid-19 vaccine status: Completed vaccines  Qualifies for Shingles  Vaccine? Yes   Zostavax completed No   Shingrix Completed?: No.    Education has been provided regarding the importance of this vaccine. Patient has been advised to call insurance company to determine  out of pocket expense if they have not yet received this vaccine. Advised may also receive vaccine at local pharmacy or Health Dept. Verbalized acceptance and understanding.  Screening Tests Health Maintenance  Topic Date Due   Zoster Vaccines- Shingrix (1 of 2) Never done   COVID-19 Vaccine (4 - Pfizer series) 08/01/2020   TETANUS/TDAP  08/09/2020   INFLUENZA VACCINE  02/19/2022   Pneumonia Vaccine 7+ Years old  Completed   DEXA SCAN  Completed   HPV VACCINES  Aged Out    Health Maintenance  Health Maintenance Due  Topic Date Due   Zoster Vaccines- Shingrix (1 of 2) Never done   COVID-19 Vaccine (4 - Pfizer series) 08/01/2020   TETANUS/TDAP  08/09/2020   INFLUENZA VACCINE  02/19/2022    Colorectal cancer screening: No longer required.   Mammogram status: No longer required due to age.  Bone Density status: Completed 01/25/15. Results reflect: Bone density results: NORMAL. Repeat every 5 years.- declined referral  Lung Cancer Screening: (Low Dose CT Chest recommended if Age 20-80 years, 30 pack-year currently smoking OR have quit w/in 15years.) does not qualify.   Additional Screening:  Hepatitis C Screening: does not qualify; Completed no  Vision Screening: Recommended annual ophthalmology exams for early detection of glaucoma and other disorders of the eye. Is the patient up to date with their annual eye exam?  Yes  Who is the provider or what is the name of the office in which the patient attends annual eye exams? [facility] If pt is not established with a provider, would they like to be referred to a provider to establish care? No .   Dental Screening: Recommended annual dental exams for proper oral hygiene  Community Resource Referral / Chronic Care Management: CRR required this visit?  No   CCM required this visit?  No      Plan:     I have personally reviewed and noted the following in the patient's chart:   Medical and social history Use of alcohol,  tobacco or illicit drugs  Current medications and supplements including opioid prescriptions.  Functional ability and status Nutritional status Physical activity Advanced directives List of other physicians Hospitalizations, surgeries, and ER visits in previous 12 months Vitals Screenings to include cognitive, depression, and falls Referrals and appointments  In addition, I have reviewed and discussed with patient certain preventive protocols, quality metrics, and best practice recommendations. A written personalized care plan for preventive services as well as general preventive health recommendations were provided to patient.     Hal Hope, LPN   07/26/7260   Nurse Notes: none

## 2022-03-21 ENCOUNTER — Ambulatory Visit: Payer: Medicare Other | Admitting: Physician Assistant

## 2022-03-28 ENCOUNTER — Encounter: Payer: Self-pay | Admitting: Physician Assistant

## 2022-03-28 ENCOUNTER — Ambulatory Visit (INDEPENDENT_AMBULATORY_CARE_PROVIDER_SITE_OTHER): Payer: Medicare Other | Admitting: Physician Assistant

## 2022-03-28 VITALS — BP 129/77 | HR 70 | Temp 97.9°F | Resp 16 | Wt 137.0 lb

## 2022-03-28 DIAGNOSIS — E7800 Pure hypercholesterolemia, unspecified: Secondary | ICD-10-CM

## 2022-03-28 DIAGNOSIS — R42 Dizziness and giddiness: Secondary | ICD-10-CM

## 2022-03-28 DIAGNOSIS — Z23 Encounter for immunization: Secondary | ICD-10-CM

## 2022-03-28 DIAGNOSIS — G43109 Migraine with aura, not intractable, without status migrainosus: Secondary | ICD-10-CM

## 2022-03-28 DIAGNOSIS — I951 Orthostatic hypotension: Secondary | ICD-10-CM

## 2022-03-28 NOTE — Progress Notes (Unsigned)
I,Annalise Mcdiarmid Robinson,acting as a Neurosurgeon for [employer], PA-C.,have documented all relevant documentation on the behalf of Natalie Lat, PA-C,as directed by  [employer], PA-C while in the presence of [employer], PA-C.   Established patient visit   Patient: Natalie Pena   DOB: 01-26-1941   81 y.o. Female  MRN: [id number] Visit Date: 03/28/2022  Today's healthcare provider: Debera Lat, PA-C   Chief Complaint  Patient presents with   Follow-up   Subjective    Follow up for Irregular heart rate Elevated BP without diagnosis of hypertension Orthostatic hypotension Bibasilar crackles Abnormal EKG  The patient was last seen for this 6 weeks ago. Changes made at last visit include: Change BP cuff and continue to measure at home - Might need to see Cardiology for evaluation. Scheduled with Cardiology for the end of this month.   She reports good compliance with treatment. She feels that condition is Unchanged. Home blood pressure reading have ranged from 130-150/ 60-70 ----------------------------------------------------------------------------------------- Follow up for left shoulder and hip pain   The patient was last seen for this 6 weeks ago. Changes made at last visit include: referral to ortho.  She reports good compliance with treatment. She feels that condition is Improved.  Problems with sleep  -----------------------------------------------------------------------------------------   Medications: Outpatient Medications Prior to Visit  Medication Sig   cholecalciferol (VITAMIN D) 1000 units tablet Take 1,000 Units by mouth daily.   Cholecalciferol (VITAMIN D3) 10 MCG (400 UNIT) tablet Take by mouth.   cyanocobalamin 1000 MCG tablet Take 1,000 mcg by mouth daily.   FLAXSEED, LINSEED, PO Take 1,000 mg by mouth daily.    ibuprofen (ADVIL,MOTRIN) 200 MG tablet Take 200 mg by mouth every 6 (six) hours as needed.     loratadine (CLARITIN) 10 MG tablet Take  10 mg by mouth daily as needed for allergies.   Magnesium 250 MG TABS Take 250 mg by mouth.   Multiple Vitamin (MULTIVITAMIN) tablet Take 1 tablet by mouth daily.    No facility-administered medications prior to visit.    Review of Systems  Constitutional:  Negative for appetite change, chills, fatigue and fever.  Respiratory:  Negative for chest tightness and shortness of breath.   Cardiovascular:  Negative for chest pain and palpitations.  Gastrointestinal:  Negative for abdominal pain, nausea and vomiting.  Musculoskeletal:  Positive for arthralgias.  Neurological:  Negative for dizziness and weakness.    {Labs  Heme  Chem  Endocrine  Serology  Results Review (optional):23779}   Objective    BP 129/77 (BP Location: Right Arm, Patient Position: Sitting, Cuff Size: Normal)   Pulse 70   Temp 97.9 F (36.6 C) (Oral)   Resp 16   Wt 137 lb (62.1 kg)   BMI 24.27 kg/m  {Show previous vital signs (optional):23777} Today's Vitals   03/28/22 0933 03/28/22 0938  BP: (!) 141/75 129/77  Pulse: 73 70  Resp: 16   Temp: 97.9 F (36.6 C)   TempSrc: Oral   Weight: 137 lb (62.1 kg)    Body mass index is 24.27 kg/m.   Physical Exam Vitals reviewed.  Constitutional:      General: She is not in acute distress.    Appearance: Normal appearance. She is well-developed. She is not diaphoretic.  HENT:     Head: Normocephalic and atraumatic.  Eyes:     General: No scleral icterus.    Conjunctiva/sclera: Conjunctivae normal.  Neck:     Thyroid: No thyromegaly.  Cardiovascular:  Rate and Rhythm: Normal rate and regular rhythm.     Pulses: Normal pulses.     Heart sounds: Normal heart sounds. No murmur heard. Pulmonary:     Effort: Pulmonary effort is normal. No respiratory distress.     Breath sounds: Normal breath sounds. No wheezing, rhonchi or rales.  Musculoskeletal:     Cervical back: Neck supple.     Right lower leg: No edema.     Left lower leg: No edema.   Lymphadenopathy:     Cervical: No cervical adenopathy.  Skin:    General: Skin is warm and dry.     Findings: No rash.  Neurological:     Mental Status: She is alert and oriented to person, place, and time. Mental status is at baseline.  Psychiatric:        Mood and Affect: Mood normal.        Behavior: Behavior normal.       No results found for any visits on 03/28/22.  Assessment & Plan     1. Orthostatic hypotension BP today was 129.77 Lifestyle modifications are advised  2. Hypercholesteremia ***  3. Ocular migraine ***  4. Dizziness Neuro exam is unremarkable   5. Need for influenza vaccination  - Flu Vaccine QUAD High Dose IM (Fluad)    No follow-ups on file.     The patient was advised to call back or seek an in-person evaluation if the symptoms worsen or if the condition fails to improve as anticipated.  I discussed the assessment and treatment plan with the patient. The patient was provided an opportunity to ask questions and all were answered. The patient agreed with the plan and demonstrated an understanding of the instructions.  The entirety of the information documented in the History of Present Illness, Review of Systems and Physical Exam were personally obtained by me. Portions of this information were initially documented by the CMA and reviewed by me for thoroughness and accuracy.  Portions of this note were created using dictation software and may contain typographical errors.    Restless leg syncrome Natalie Pena, Natalie Pena  [facility] [phone] (phone) [phone] (fax)  [facility]

## 2022-03-30 DIAGNOSIS — I951 Orthostatic hypotension: Secondary | ICD-10-CM | POA: Insufficient documentation

## 2022-03-30 DIAGNOSIS — R42 Dizziness and giddiness: Secondary | ICD-10-CM | POA: Insufficient documentation

## 2022-03-30 DIAGNOSIS — G43109 Migraine with aura, not intractable, without status migrainosus: Secondary | ICD-10-CM | POA: Insufficient documentation

## 2022-04-11 ENCOUNTER — Ambulatory Visit: Payer: Medicare Other | Admitting: Internal Medicine

## 2022-04-15 NOTE — Progress Notes (Unsigned)
Cardiology Office Note  Date:  04/16/2022   ID:  Natalie, Pena [birth year], MRN [id number]  PCP:  Erasmo Downer, MD   Chief Complaint  Patient presents with   New Patient (Initial Visit)    Ref by Dr. Shirlee Latch for abnormal EKG. Patient c/o dizziness Medications reviewed by the patient verbally.     HPI:  [name] is a 81 year old woman with past medical history of Migraines, occular, followed by neurology Dizziness Hyperlipidemia, untreated CT head: atherosclerotic calcification of the major vessels at the base of the brain Who presents For evaluation of dizzy episode  3 occular migraines week before July 4 dizziness Then spell of dizziness on July 4th 2023,  while sitting in recliner, in afternoon, sudden onset, significant sx, 10 min, spinning EMS called by her daughter No known trigger. Came on abruptly.  Ems came to house; BP elevated 180 systolic. Did not go to er. Symptoms resolved in 5 mins. Describes dizziness as spinning sensation. Denies associated symptoms including headache, vision changes, weakness, stroke symptoms, tinnitus, hearing loss, N/V.   Seen by neurology, recommended to try meclizine as needed CT head 02/20/22: no CVA Declined head MRI  At home she reports BP 130-140 systolic; max was 150 systolic.  Wobbly when walking Walks with great grandchild around the house 10 times  Seen by primary care January 24, 2022 Systolic pressures 140 down to 123 from supine to standing position, heart rate 62 up to 76  Orthostatics today negative  No significant change, systolic pressures remained in the 120 range the heart rate did increase from 78 up to 98 with standing Reports that she is trying to stay hydrated Does not appreciate orthostasis symptoms and has not felt slow in the past  EKG personally reviewed by myself on todays visit Normal sinus rhythm with rate 81 bpm no significant ST-T wave changes, left axis deviation  PMH:   has a  past medical history of Hypercholesterolemia and Osteoporosis.  PSH:    Past Surgical History:  Procedure Laterality Date   APPENDECTOMY     BILATERAL ANTERIOR TOTAL HIP ARTHROPLASTY     CESAREAN SECTION  1973   EYE SURGERY Left 05/2014   cataract surgery   TONSILLECTOMY AND ADENOIDECTOMY  1940   TOTAL ABDOMINAL HYSTERECTOMY  1974   fibroids   unilateral oophorectomy  1974   left    Current Outpatient Medications  Medication Sig Dispense Refill   cholecalciferol (VITAMIN D) 1000 units tablet Take 1,000 Units by mouth daily.     Cholecalciferol (VITAMIN D3) 10 MCG (400 UNIT) tablet Take by mouth.     cyanocobalamin 1000 MCG tablet Take 1,000 mcg by mouth daily.     FLAXSEED, LINSEED, PO Take 1,000 mg by mouth daily.      ibuprofen (ADVIL,MOTRIN) 200 MG tablet Take 200 mg by mouth every 6 (six) hours as needed.       loratadine (CLARITIN) 10 MG tablet Take 10 mg by mouth daily as needed for allergies.     Magnesium 250 MG TABS Take 250 mg by mouth.     Multiple Vitamin (MULTIVITAMIN) tablet Take 1 tablet by mouth daily.      No current facility-administered medications for this visit.    Allergies:   Shellfish allergy   Social History:  The patient  reports that she has never smoked. She has never used smokeless tobacco. She reports that she does not drink alcohol and does not use drugs.  Family History:   family history includes Breast cancer (age of onset: 35) in her paternal grandmother; Heart attack in her mother; Pancreatic cancer in her father; Stroke in her sister.    Review of Systems: Review of Systems  Constitutional: Negative.   HENT: Negative.    Respiratory: Negative.    Cardiovascular: Negative.   Gastrointestinal: Negative.   Musculoskeletal: Negative.   Neurological:  Positive for dizziness.  Psychiatric/Behavioral: Negative.    All other systems reviewed and are negative.   PHYSICAL EXAM: VS:  BP (!) 146/72 (BP Location: Right Arm, Patient Position:  Sitting, Cuff Size: Normal)   Pulse 81   Ht 5\' 5"  (1.651 m)   Wt 137 lb 4 oz (62.3 kg)   SpO2 98%   BMI 22.84 kg/m  , BMI Body mass index is 22.84 kg/m. GEN: Well nourished, well developed, in no acute distress HEENT: normal Neck: no JVD, carotid bruits, or masses Cardiac: RRR; no murmurs, rubs, or gallops,no edema  Respiratory:  clear to auscultation bilaterally, normal work of breathing GI: soft, nontender, nondistended, + BS MS: no deformity or atrophy Skin: warm and dry, no rash Neuro:  Strength and sensation are intact Psych: euthymic mood, full affect   Recent Labs: 01/25/2022: ALT 20; BUN 12; Creatinine, Ser 0.65; Hemoglobin 14.3; NT-Pro BNP 161; Platelets 270; Potassium 4.3; Sodium 138    Lipid Panel Lab Results  Component Value Date   CHOL 238 (H) 01/25/2022   HDL 109 01/25/2022   LDLCALC 116 (H) 01/25/2022   TRIG 77 01/25/2022      Wt Readings from Last 3 Encounters:  04/16/22 137 lb 4 oz (62.3 kg)  03/28/22 137 lb (62.1 kg)  02/27/22 138 lb (62.6 kg)       ASSESSMENT AND PLAN:  Problem List Items Addressed This Visit       Cardiology Problems   Hypercholesteremia   Relevant Orders   EKG 12-Lead     Other   Dizziness - Primary   Relevant Orders   EKG 12-Lead   Prediabetes   Relevant Orders   EKG 12-Lead   Dizziness  episode July 4 Lasting 10 minutes while in the sitting position, she describes this as a severe spinning in her head Resolved without intervention Blood pressure was elevated per EMS Denies orthostasis symptoms, orthostatic numbers negative She is staying hydrated No clear indication of cardiac arrhythmia or underlying cardiac process Head CT , there is atherosclerotic disease on CT scan, plan as below  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 [place], [place]

## 2022-04-16 ENCOUNTER — Encounter: Payer: Self-pay | Admitting: Cardiovascular Disease

## 2022-04-16 ENCOUNTER — Ambulatory Visit: Payer: Medicare Other | Attending: Internal Medicine | Admitting: Cardiovascular Disease

## 2022-04-16 VITALS — BP 146/72 | HR 81 | Ht 65.0 in | Wt 137.2 lb

## 2022-04-16 DIAGNOSIS — R7303 Prediabetes: Secondary | ICD-10-CM

## 2022-04-16 DIAGNOSIS — E7800 Pure hypercholesterolemia, unspecified: Secondary | ICD-10-CM | POA: Diagnosis not present

## 2022-04-16 DIAGNOSIS — R42 Dizziness and giddiness: Secondary | ICD-10-CM | POA: Diagnosis not present

## 2022-04-16 MED ORDER — ASPIRIN 81 MG PO TBEC: 81.0000 mg | DELAYED_RELEASE_TABLET | Freq: Every day | ORAL | 3 refills | Status: AC

## 2022-04-16 MED ORDER — ROSUVASTATIN CALCIUM 5 MG PO TABS: 5.0000 mg | ORAL_TABLET | Freq: Every day | ORAL | 3 refills | Status: DC

## 2022-04-16 NOTE — Patient Instructions (Addendum)
Please call if you have more dizzy episodes   Medication Instructions:   Please start crestor 5 mg daily for cholesterol  Take Aspirin 81 mg every other day  If you need a refill on your cardiac medications before your next appointment, please call your pharmacy.   Lab work: No new labs needed  Testing/Procedures: No new testing needed  Follow-Up: At [facility], you and your health needs are our priority.  As part of our continuing mission to provide you with exceptional heart care, we have created designated Provider Care Teams.  These Care Teams include your primary Cardiologist (physician) and Advanced Practice Providers (APPs -  Physician Assistants and Nurse Practitioners) who all work together to provide you with the care you need, when you need it.  You will need a follow up appointment as needed  Providers on your designated Care Team:   Murray Hodgkins, NP Christell Faith, PA-C Cadence Kathlen Mody, [place]  COVID-19 Vaccine Information can be found at: [email or web address] For questions related to vaccine distribution or appointments, please email [email or web address] or call [phone].

## 2022-05-08 NOTE — Progress Notes (Unsigned)
   I,Kennet Mccort S Emme Rosenau,acting as a Education administrator for Lavon Paganini, MD.,have documented all relevant documentation on the behalf of Lavon Paganini, MD,as directed by  Lavon Paganini, MD while in the presence of Lavon Paganini, MD.     Established patient visit   Patient: Natalie Pena   DOB: Jan 03, 1941   81 y.o. Female  MRN: [id number] Visit Date: 05/09/2022  Today's healthcare provider: Lavon Paganini, MD   No chief complaint on file.  Subjective    HPI  Follow up for ***  The patient was last seen for this {NUMBERS 1-12:18279} {days/wks/mos/yrs:310907} ago. Changes made at last visit include ***.  She reports {excellent/good/fair/poor:19665} compliance with treatment. She feels that condition is {improved/worse/unchanged:3041574}. She {is/is not:21021397} having side effects. ***  -----------------------------------------------------------------------------------------   Medications: Outpatient Medications Prior to Visit  Medication Sig   aspirin EC 81 MG tablet Take 1 tablet (81 mg total) by mouth daily. Swallow whole.   cholecalciferol (VITAMIN D) 1000 units tablet Take 1,000 Units by mouth daily.   Cholecalciferol (VITAMIN D3) 10 MCG (400 UNIT) tablet Take by mouth.   cyanocobalamin 1000 MCG tablet Take 1,000 mcg by mouth daily.   FLAXSEED, LINSEED, PO Take 1,000 mg by mouth daily.    ibuprofen (ADVIL,MOTRIN) 200 MG tablet Take 200 mg by mouth every 6 (six) hours as needed.     loratadine (CLARITIN) 10 MG tablet Take 10 mg by mouth daily as needed for allergies.   Magnesium 250 MG TABS Take 250 mg by mouth.   Multiple Vitamin (MULTIVITAMIN) tablet Take 1 tablet by mouth daily.    rosuvastatin (CRESTOR) 5 MG tablet Take 1 tablet (5 mg total) by mouth daily.   No facility-administered medications prior to visit.    Review of Systems  {Labs  Heme  Chem  Endocrine  Serology  Results Review (optional):23779}   Objective    There were no vitals taken  for this visit. {Show previous vital signs (optional):23777}  Physical Exam  ***  No results found for any visits on 05/09/22.  Assessment & Plan     ***  No follow-ups on file.      {provider attestation***:1}   Lavon Paganini, MD  [facility] [phone] (phone) [phone] (fax)  [place]

## 2022-05-09 ENCOUNTER — Ambulatory Visit (INDEPENDENT_AMBULATORY_CARE_PROVIDER_SITE_OTHER): Payer: Medicare Other | Admitting: Family Medicine

## 2022-05-09 ENCOUNTER — Encounter: Payer: Self-pay | Admitting: Family Medicine

## 2022-05-09 VITALS — BP 117/69 | HR 64 | Temp 97.5°F | Resp 16 | Ht 61.5 in | Wt 136.9 lb

## 2022-05-09 DIAGNOSIS — E7800 Pure hypercholesterolemia, unspecified: Secondary | ICD-10-CM

## 2022-05-09 DIAGNOSIS — M7552 Bursitis of left shoulder: Secondary | ICD-10-CM | POA: Diagnosis not present

## 2022-05-09 DIAGNOSIS — I739 Peripheral vascular disease, unspecified: Secondary | ICD-10-CM

## 2022-05-09 MED ORDER — METHYLPREDNISOLONE ACETATE 40 MG/ML IJ SUSP
40.0000 mg | Freq: Once | INTRAMUSCULAR | Status: AC
  Administered 2022-05-09: 40 mg via INTRAMUSCULAR

## 2022-05-09 MED ORDER — ROSUVASTATIN CALCIUM 5 MG PO TABS: 5.0000 mg | ORAL_TABLET | ORAL | 3 refills | Status: DC

## 2022-05-09 MED ORDER — LIDOCAINE HCL (PF) 1 % IJ SOLN
4.0000 mL | Freq: Once | INTRAMUSCULAR | Status: AC
  Administered 2022-05-09: 4 mL via INTRADERMAL

## 2022-05-09 NOTE — Assessment & Plan Note (Signed)
See discussion above about 2ndary prevention Patient agrees to take ASA 3 times weekly and Crestor weekly F/u in3 m and repeat CMP and FLP

## 2022-05-09 NOTE — Assessment & Plan Note (Signed)
Discussed with patient that though she has had slightly elevated cholesterol for many years and she is in her 69s where we would typically not start primary prevention statin therapy, she now has known PAD with atherosclerotic plaques noted on her CT, so this does warrant secondary prevention with statin and aspirin as recommended by cardiology She is worried about possible side effects and does not like taking medications She does agree to take aspirin 3 times a week and Crestor weekly Pending improvement in lipid panel in 3 months, may consider slowly increasing Crestor frequency Discussed stroke precautions//symptoms to watch for

## 2022-05-09 NOTE — Assessment & Plan Note (Signed)
New problem, but started about 3 months ago She has tried NSAIDs and rest She continues to have pain with overhead motions Exam is consistent with shoulder bursitis of the left She is agreeable to corticosteroid injection today Home exercise program given Return precautions discussed  INJECTION: Patient was given informed consent,. Appropriate time out was taken. Area prepped and draped in usual sterile fashion.  1 cc of depo-medrol 40 mg/ml plus 4 cc of 1% lidocaine without epinephrine was injected into the left shoulder subacromial space using a(n) posterior approach. The patient tolerated the procedure well. There were no complications. Post procedure instructions were given.

## 2022-08-12 ENCOUNTER — Ambulatory Visit: Payer: Medicare Other | Admitting: Family Medicine

## 2022-11-29 NOTE — Progress Notes (Unsigned)
I,Casaundra Takacs S Marque Bango,acting as a Neurosurgeon for Shirlee Latch, MD.,have documented all relevant documentation on the behalf of Shirlee Latch, MD,as directed by  Shirlee Latch, MD while in the presence of Shirlee Latch, MD.    Complete physical exam   Patient: Natalie Pena   DOB: [birth year]   82 y.o. Female  MRN: [id number] Visit Date: 12/02/2022  Today's healthcare provider: Shirlee Latch, MD   No chief complaint on file.  Subjective    Kyera A Kooiman is a 82 y.o. female who presents today for a complete physical exam.  She reports consuming a {diet types:17450} diet. {Exercise:19826} She generally feels {well/fairly well/poorly:18703}. She reports sleeping {well/fairly well/poorly:18703}. She {does/does not:200015} have additional problems to discuss today.  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    Social Determinants of Health   Financial Resource  Strain: Low Risk  (02/27/2022)   Overall Financial Resource Strain (CARDIA)    Difficulty of Paying Living Expenses: Not hard at all  Food Insecurity: No Food Insecurity (02/27/2022)   Hunger Vital Sign    Worried About Running Out of Food in the Last Year: Never true    Ran Out of Food in the Last Year: Never true  Transportation Needs: No Transportation Needs (02/27/2022)   PRAPARE - Administrator, Civil Service (Medical): No    Lack of Transportation (Non-Medical): No  Physical Activity: Insufficiently Active (02/27/2022)   Exercise Vital Sign    Days of Exercise per Week: 7 days    Minutes of Exercise per Session: 20 min  Stress: No Stress Concern Present (02/27/2022)   [employer] of Occupational Health - Occupational Stress Questionnaire    Feeling of Stress : Not at all  Social Connections: Moderately Isolated (02/27/2022)   Social Connection and Isolation Panel [NHANES]    Frequency of Communication with Friends and Family: More than three times a week    Frequency of Social Gatherings with Friends and Family: Twice a week    Attends Religious Services: More than 4 times per year    Active Member of [employer] or Organizations: No    Attends Banker Meetings: Never    Marital Status: Widowed  Intimate Partner Violence: Not At Risk (02/27/2022)   Humiliation, Afraid, Rape,  and Kick questionnaire    Fear of Current or Ex-Partner: No    Emotionally Abused: No    Physically Abused: No    Sexually Abused: No   Family Status  Relation Name Status   Father  Deceased       pancreatic cancer   Mother  Deceased       MI, CAd   PGM  (Not Specified)   Sister 1/2 sister Alive   Family History  Problem Relation Age of Onset   Pancreatic cancer Father    Heart attack Mother    Breast cancer Paternal Grandmother 80   Stroke Sister    Allergies  Allergen Reactions   Shellfish Allergy Other (See Comments)    Patient Care Team: Bacigalupo, Marzella Schlein, MD as PCP -  General (Family Medicine) Lockie Mola, MD as Referring Physician (Ophthalmology)   Medications: Outpatient Medications Prior to Visit  Medication Sig   aspirin EC 81 MG tablet Take 1 tablet (81 mg total) by mouth daily. Swallow whole. (Patient taking differently: Take 81 mg by mouth 3 (three) times a week. Swallow whole.)   cholecalciferol (VITAMIN D) 1000 units tablet Take 1,000 Units by mouth daily.   Cholecalciferol (VITAMIN D3) 10 MCG (400 UNIT) tablet Take by mouth.   ibuprofen (ADVIL,MOTRIN) 200 MG tablet Take 200 mg by mouth every 6 (six) hours as needed.     loratadine (CLARITIN) 10 MG tablet Take 10 mg by mouth daily as needed for allergies.   Magnesium 250 MG TABS Take 250 mg by mouth.   Multiple Vitamin (MULTIVITAMIN) tablet Take 1 tablet by mouth daily.    rosuvastatin (CRESTOR) 5 MG tablet Take 1 tablet (5 mg total) by mouth once a week.   No facility-administered medications prior to visit.    Review of Systems  All other systems reviewed and are negative.   Last CBC Lab Results  Component Value Date   WBC 7.0 01/25/2022   HGB 14.3 01/25/2022   HCT 42.5 01/25/2022   MCV 87 01/25/2022   MCH 29.2 01/25/2022   RDW 13.3 01/25/2022   PLT 270 01/25/2022   Last metabolic panel Lab Results  Component Value Date   GLUCOSE 105 (H) 01/25/2022   NA 138 01/25/2022   K 4.3 01/25/2022   CL 100 01/25/2022   CO2 22 01/25/2022   BUN 12 01/25/2022   CREATININE 0.65 01/25/2022   EGFR 88 01/25/2022   CALCIUM 9.7 01/25/2022   PROT 6.9 01/25/2022   ALBUMIN 4.5 01/25/2022   LABGLOB 2.4 01/25/2022   AGRATIO 1.9 01/25/2022   BILITOT 0.5 01/25/2022   ALKPHOS 58 01/25/2022   AST 18 01/25/2022   ALT 20 01/25/2022   ANIONGAP 3 (L) 11/04/2013   Last lipids Lab Results  Component Value Date   CHOL 238 (H) 01/25/2022   HDL 109 01/25/2022   LDLCALC 116 (H) 01/25/2022   TRIG 77 01/25/2022   CHOLHDL 2.2 01/25/2022   Last hemoglobin A1c Lab Results  Component Value  Date   HGBA1C 5.6 02/06/2021   Last thyroid functions Lab Results  Component Value Date   TSH 4.170 03/18/2018   Last vitamin D Lab Results  Component Value Date   VD25OH 35.3 02/06/2021      Objective    There were no vitals taken for this visit. BP Readings from Last 3 Encounters:  05/09/22 117/69  04/16/22 (!) 146/72  03/28/22 129/77   Wt Readings from Last 3 Encounters:  05/09/22 136 lb 14.4 oz (  62.1 kg)  04/16/22 137 lb 4 oz (62.3 kg)  03/28/22 137 lb (62.1 kg)       Physical Exam  ***  Last depression screening scores    05/09/2022    9:39 AM 02/27/2022   10:02 AM 01/24/2022   11:01 AM  PHQ 2/9 Scores  PHQ - 2 Score 0 0 0  PHQ- 9 Score 4 0 1   Last fall risk screening    05/09/2022    9:39 AM  Fall Risk   Falls in the past year? 1  Number falls in past yr: 1  Injury with Fall? 1  Risk for fall due to : History of fall(s)  Follow up Falls evaluation completed;Education provided;Falls prevention discussed   Last Audit-C alcohol use screening    05/09/2022    9:40 AM  Alcohol Use Disorder Test (AUDIT)  Patient refused Alcohol Screening Tool Yes  1. How often do you have a drink containing alcohol? 0  2. How many drinks containing alcohol do you have on a typical day when you are drinking? 0  3. How often do you have six or more drinks on one occasion? 0  AUDIT-C Score 0   A score of 3 or more in women, and 4 or more in men indicates increased risk for alcohol abuse, EXCEPT if all of the points are from question 1   No results found for any visits on 12/02/22.  Assessment & Plan    Routine Health Maintenance and Physical Exam  Exercise Activities and Dietary recommendations  Goals      DIET - EAT MORE FRUITS AND VEGETABLES     Exercise 150 minutes per week (moderate activity)     Exercise 3x per week (20 min per time)     Recommend exercising (walking) three times a week for 20 minutes.          Immunization History  Administered  Date(s) Administered   Fluad Quad(high Dose 65+) 03/28/2022   Influenza, High Dose Seasonal PF 04/11/2017   Influenza, Seasonal, Injecte, Preservative Fre 03/30/2019   Influenza-Unspecified 05/07/2021   PFIZER Comirnaty(Gray Top)Covid-19 Tri-Sucrose Vaccine 08/25/2019, 09/15/2019, 06/06/2020   PFIZER(Purple Top)SARS-COV-2 Vaccination 02/13/2021   Pneumococcal Conjugate-13 12/29/2013   Pneumococcal Polysaccharide-23 07/22/2005   Tdap 08/09/2010    Health Maintenance  Topic Date Due   Zoster Vaccines- Shingrix (1 of 2) Never done   DTaP/Tdap/Td (2 - Td or Tdap) 08/09/2020   COVID-19 Vaccine (5 - 2023-24 season) 03/22/2022   INFLUENZA VACCINE  02/20/2023   Medicare Annual Wellness (AWV)  02/28/2023   Pneumonia Vaccine 72+ Years old  Completed   DEXA SCAN  Completed   HPV VACCINES  Aged Out    Discussed health benefits of physical activity, and encouraged her to engage in regular exercise appropriate for her age and condition.  ***  No follow-ups on file.     {provider attestation***:1}   Shirlee Latch, MD  [facility] [phone] (phone) [phone] (fax)  [facility]

## 2022-12-02 ENCOUNTER — Encounter: Payer: Self-pay | Admitting: Family Medicine

## 2022-12-02 ENCOUNTER — Ambulatory Visit (INDEPENDENT_AMBULATORY_CARE_PROVIDER_SITE_OTHER): Payer: Medicare Other | Admitting: Family Medicine

## 2022-12-02 VITALS — BP 137/85 | HR 75 | Temp 98.3°F | Resp 12 | Ht 61.5 in | Wt 138.0 lb

## 2022-12-02 DIAGNOSIS — Z0000 Encounter for general adult medical examination without abnormal findings: Secondary | ICD-10-CM | POA: Diagnosis not present

## 2022-12-02 DIAGNOSIS — R7303 Prediabetes: Secondary | ICD-10-CM | POA: Diagnosis not present

## 2022-12-02 DIAGNOSIS — M810 Age-related osteoporosis without current pathological fracture: Secondary | ICD-10-CM | POA: Diagnosis not present

## 2022-12-02 DIAGNOSIS — E559 Vitamin D deficiency, unspecified: Secondary | ICD-10-CM

## 2022-12-02 DIAGNOSIS — E7800 Pure hypercholesterolemia, unspecified: Secondary | ICD-10-CM | POA: Diagnosis not present

## 2022-12-02 NOTE — Assessment & Plan Note (Signed)
Continue supplement Recheck level 

## 2022-12-02 NOTE — Assessment & Plan Note (Signed)
Declines repeat DEXA as she would not treat despite results Encourage Ca and Vit D supplementation and weight bearing exercise 

## 2022-12-02 NOTE — Assessment & Plan Note (Signed)
Recommend low carb diet Recheck A1c  - last was wnl

## 2022-12-02 NOTE — Assessment & Plan Note (Signed)
Patient did not tolerate Crestor We had discussed weekly dosing, but she discontinued Reviewed last lipid panel Not currently on a statin Recheck FLP and CMP Discussed diet and exercise

## 2022-12-03 LAB — LIPID PANEL
Chol/HDL Ratio: 2.2 ratio (ref 0.0–4.4)
Cholesterol, Total: 241 mg/dL — ABNORMAL HIGH (ref 100–199)
HDL: 110 mg/dL (ref >39)
LDL Chol Calc (NIH): 118 mg/dL — ABNORMAL HIGH (ref 0–99)
Triglycerides: 75 mg/dL (ref 0–149)
VLDL Cholesterol Cal: 13 mg/dL (ref 5–40)

## 2022-12-03 LAB — COMPREHENSIVE METABOLIC PANEL
ALT: 15 IU/L (ref 0–32)
AST: 16 IU/L (ref 0–40)
Albumin/Globulin Ratio: 1.9 (ref 1.2–2.2)
Albumin: 4.2 g/dL (ref 3.7–4.7)
Alkaline Phosphatase: 64 IU/L (ref 44–121)
BUN/Creatinine Ratio: 16 (ref 12–28)
BUN: 10 mg/dL (ref 8–27)
Bilirubin Total: 0.4 mg/dL (ref 0.0–1.2)
CO2: 25 mmol/L (ref 20–29)
Calcium: 9.6 mg/dL (ref 8.7–10.3)
Chloride: 102 mmol/L (ref 96–106)
Creatinine, Ser: 0.63 mg/dL (ref 0.57–1.00)
Globulin, Total: 2.2 g/dL (ref 1.5–4.5)
Glucose: 102 mg/dL — ABNORMAL HIGH (ref 70–99)
Potassium: 4.7 mmol/L (ref 3.5–5.2)
Sodium: 140 mmol/L (ref 134–144)
Total Protein: 6.4 g/dL (ref 6.0–8.5)
eGFR: 88 mL/min/{1.73_m2} (ref >59)

## 2022-12-03 LAB — HEMOGLOBIN A1C
Est. average glucose Bld gHb Est-mCnc: 120 mg/dL
Hgb A1c MFr Bld: 5.8 % — ABNORMAL HIGH (ref 4.8–5.6)

## 2022-12-03 LAB — VITAMIN D 25 HYDROXY (VIT D DEFICIENCY, FRACTURES): Vit D, 25-Hydroxy: 47.6 ng/mL (ref 30.0–100.0)

## 2023-02-14 ENCOUNTER — Encounter: Payer: Self-pay | Admitting: Family Medicine

## 2023-02-14 ENCOUNTER — Ambulatory Visit (INDEPENDENT_AMBULATORY_CARE_PROVIDER_SITE_OTHER): Payer: Medicare Other | Admitting: Family Medicine

## 2023-02-14 VITALS — BP 155/71 | HR 80 | Temp 97.9°F | Ht 61.5 in | Wt 140.1 lb

## 2023-02-14 DIAGNOSIS — R300 Dysuria: Secondary | ICD-10-CM

## 2023-02-14 LAB — POCT URINALYSIS DIPSTICK
Bilirubin, UA: NEGATIVE
Blood, UA: NEGATIVE
Glucose, UA: NEGATIVE
Ketones, UA: NEGATIVE
Leukocytes, UA: NEGATIVE
Nitrite, UA: NEGATIVE
Protein, UA: NEGATIVE
Spec Grav, UA: <=1.005 — AB (ref 1.010–1.025)
Urobilinogen, UA: 0.2 E.U./dL
pH, UA: 7.5 (ref 5.0–8.0)

## 2023-02-14 MED ORDER — CEPHALEXIN 500 MG PO CAPS: 500.0000 mg | ORAL_CAPSULE | Freq: Two times a day (BID) | ORAL | 0 refills | Status: DC

## 2023-02-14 NOTE — Progress Notes (Signed)
Established patient visit   Patient: Natalie Pena   DOB: 06-May-1941   82 y.o. Female  MRN: [id number] Visit Date: 02/14/2023  Today's healthcare provider: Mila Merry, MD   Chief Complaint  Patient presents with   Urinary Symptoms    Burning during urination, started a week and a half ago   Subjective    Discussed the use of AI scribe software for clinical note transcription with the patient, who gave verbal consent to proceed.  History of Present Illness   The patient has been experiencing symptoms suggestive of a urinary tract infection for approximately one and a half weeks. The symptoms have been fluctuating, with periods of worsening followed by temporary relief. The patient initially attributed the symptoms to dietary intake, specifically acidic tomatoes, but the persistence of symptoms led her to seek medical attention. She has been attempting self-management strategies, including increased water intake, consumption of cranberry juice, and use of baking powder, with variable success.  The patient reported a single episode of hematuria, which was alarming to her. However, at the time of the consultation, the symptoms were not severe.       Medications: Outpatient Medications Prior to Visit  Medication Sig   aspirin EC 81 MG tablet Take 1 tablet (81 mg total) by mouth daily. Swallow whole. (Patient taking differently: Take 81 mg by mouth once a week. Swallow whole.)   cholecalciferol (VITAMIN D) 1000 units tablet Take 1,000 Units by mouth daily.   Cholecalciferol (VITAMIN D3) 10 MCG (400 UNIT) tablet Take by mouth.   Flaxseed, Linseed, (FLAX SEED OIL PO) Take 1,200 mg by mouth daily in the afternoon.   ibuprofen (ADVIL,MOTRIN) 200 MG tablet Take 200 mg by mouth every 6 (six) hours as needed.     loratadine (CLARITIN) 10 MG tablet Take 10 mg by mouth daily as needed for allergies.   Magnesium 250 MG TABS Take 250 mg by mouth.   Multiple Vitamin (MULTIVITAMIN) tablet  Take 1 tablet by mouth daily.    rosuvastatin (CRESTOR) 5 MG tablet Take 1 tablet (5 mg total) by mouth once a week. (Patient not taking: Reported on 12/02/2022)   No facility-administered medications prior to visit.   Review of Systems  Constitutional:  Negative for appetite change, chills, fatigue and fever.  Respiratory:  Negative for chest tightness and shortness of breath.   Cardiovascular:  Negative for chest pain and palpitations.  Gastrointestinal:  Negative for abdominal pain, nausea and vomiting.  Neurological:  Negative for dizziness and weakness.      Objective    BP (!) 155/71   Pulse 80   Temp 97.9 F (36.6 C) (Oral)   Ht 5' 1.5" (1.562 m)   Wt 140 lb 1.6 oz (63.5 kg)   SpO2 98%   BMI 26.05 kg/m    Physical Exam  General appearance: Well developed, well nourished female, cooperative and in no acute distress Head: Normocephalic, without obvious abnormality, atraumatic Respiratory: Respirations even and unlabored, normal respiratory rate Extremities: All extremities are intact.  Skin: Skin color, texture, turgor normal. No rashes seen  Psych: Appropriate mood and affect. Neurologic: Mental status: Alert, oriented to person, place, and time, thought content appropriate.     Results for orders placed or performed in visit on 02/14/23  POCT Urinalysis Dipstick  Result Value Ref Range   Color, UA yellow    Clarity, UA clear    Glucose, UA Negative Negative   Bilirubin, UA neg  Ketones, UA neg    Spec Grav, UA <=1.005 (A) 1.010 - 1.025   Blood, UA neg    pH, UA 7.5 5.0 - 8.0   Protein, UA Negative Negative   Urobilinogen, UA 0.2 0.2 or 1.0 E.U./dL   Nitrite, UA neg    Leukocytes, UA Negative Negative   Appearance clear    Odor none     Assessment & Plan     Assessment and Plan    Urinary Tract Infection (UTI): Symptoms of UTI for 1.5 weeks with intermittent improvement and worsening. Noted one episode of hematuria. Urinalysis in office today was  clear. -Send urine sample for culture to definitively rule out UTI. -Start empiric treatment with Cephalexin until culture results return.          Mila Merry, MD  [facility] [phone] (phone) [phone] (fax)  [facility]

## 2023-02-17 ENCOUNTER — Ambulatory Visit: Payer: Self-pay

## 2023-02-17 NOTE — Patient Outreach (Signed)
  Care Coordination   02/17/2023 Name: Natalie Pena MRN: [id number] DOB: 1940-11-20   Care Coordination Outreach Attempts:  An unsuccessful telephone outreach was attempted today to offer the patient information about available care coordination services.  Follow Up Plan:  Additional outreach attempts will be made to offer the patient care coordination information and services.   Encounter Outcome:  No Answer   Care Coordination Interventions:  No, not indicated    SIG Lysle Morales, BSW Social Worker [facility]  [phone]

## 2023-02-19 ENCOUNTER — Telehealth: Payer: Self-pay

## 2023-02-19 NOTE — Telephone Encounter (Signed)
Culture showed infection , but grew many different types of bacteria. Need to finish antibiotic that was prescribed and let me know if not completely better when finished.

## 2023-02-19 NOTE — Telephone Encounter (Signed)
Copied from CRM [id number]. Topic: General - Other >> Feb 19, 2023 12:50 PM Dondra Prader E wrote: Reason for CRM: Pt called to discuss her lab results,   Best contact: [phone]

## 2023-02-19 NOTE — Telephone Encounter (Signed)
Patient advised. She reports feeling better. She completed ABX yesterday.

## 2023-02-20 ENCOUNTER — Telehealth: Payer: Self-pay | Admitting: Family Medicine

## 2023-02-20 ENCOUNTER — Other Ambulatory Visit: Payer: Self-pay | Admitting: Family Medicine

## 2023-02-20 ENCOUNTER — Ambulatory Visit: Payer: Self-pay | Admitting: *Deleted

## 2023-02-20 DIAGNOSIS — R300 Dysuria: Secondary | ICD-10-CM

## 2023-02-20 MED ORDER — FOSFOMYCIN TROMETHAMINE 3 G PO PACK: 3.0000 g | PACK | Freq: Once | ORAL | 0 refills | Status: AC

## 2023-02-20 NOTE — Telephone Encounter (Signed)
Pt is calling in requesting medication for a UTI pt says the medication she took currently did not work and she needs something different. Pt says she has been calling since 9 AM and hasn't heard back. Pt says she is in pain and needs someone to call her back. Please follow up with pt.

## 2023-02-20 NOTE — Telephone Encounter (Signed)
Pt states her UTI has gotten worse over night,  she has burning w/ urination.  Pt did not get any sleep last night due to the pain. Pt has finished abx, and feels like she did not have enough abx to rid this infection.  Could you please send in new abx to [facility] [place], [place] - [address] .

## 2023-02-20 NOTE — Telephone Encounter (Signed)
Duplicate encounter. Please see telephone encounter from today medication refill, medication management

## 2023-02-20 NOTE — Telephone Encounter (Signed)
Patient advised. Verbalized understanding 

## 2023-02-20 NOTE — Telephone Encounter (Signed)
Medication Refill - Medication: cephALEXin   Has the patient contacted their pharmacy? Yes.    (Agent: If yes, when and what did the pharmacy advise?) Contact PCP   Preferred Pharmacy (with phone number or street name): [facility]   Has the patient been seen for an appointment in the last year OR does the patient have an upcoming appointment? Yes.    Agent: Please be advised that RX refills may take up to 3 business days. We ask that you follow-up with your pharmacy.

## 2023-02-20 NOTE — Telephone Encounter (Signed)
Request for ABX refill.

## 2023-02-20 NOTE — Telephone Encounter (Signed)
Summary: UTI symptoms   Patient stated she came into the office for UTI but medication given has not taken the symptoms away. She is requesting something diffierent for the UTI symptoms. Please f/u with patient           Chief Complaint: continued sx of burning with urination and left hip pain  Symptoms: see above, patient completed course of cephalexin on Tuesday. Concerned regarding continued sx and left hip getting infection hx hip replacement.  Frequency: Tuesday  Pertinent Negatives: Patient denies fever no blood in urine  Disposition: [] ED /[] Urgent Care (no appt availability in office) / [] Appointment(In office/virtual)/ []  [place] [facility]/ [] Home Care/ [] Refused Recommended Disposition /[] [place] [facility]/ [x]  Follow-up with PCP Additional Notes:   Please advise if another appt needed. Requesting additional medication . Patient would like a call back. Requesting UA results. No documentation from provider noted .      Reason for Disposition  [1] Taking antibiotic > 72 hours (3 days) for UTI AND [2] painful urination or frequency is SAME (unchanged, not better)  Answer Assessment - Initial Assessment Questions 1. MAIN SYMPTOM: "What is the main symptom you are concerned about?" (e.g., painful urination, urine frequency)     Continued burning with urination. And left hip pain hx hip replacement  2. BETTER-SAME-WORSE: "Are you getting better, staying the same, or getting worse compared to how you felt at your last visit to the doctor (most recent medical visit)?"     Not better 3. PAIN: "How bad is the pain?"  (e.g., Scale 1-10; mild, moderate, or severe)   - MILD (1-3): complains slightly about urination hurting   - MODERATE (4-7): interferes with normal activities     - SEVERE (8-10): excruciating, unwilling or unable to urinate because of the pain      Burning with urination continues  4. FEVER: "Do you have a fever?" If Yes, ask: "What is it, how was it  measured, and when did it start?"     na 5. OTHER SYMPTOMS: "Do you have any other symptoms?" (e.g., blood in the urine, flank pain, vaginal discharge)     Left hip pain . 6. DIAGNOSIS: "When was the UTI diagnosed?" "By whom?" "Was it a kidney infection, bladder infection or both?"     Possible UTI per Dr. Sherrie Mustache  7. ANTIBIOTIC: "What antibiotic(s) are you taking?" "How many times per day?"     Cephalexin  8. ANTIBIOTIC - START DATE: "When did you start taking the antibiotic?"      Completed course Tuesday  Protocols used: Urinary Tract Infection on Antibiotic Follow-up Call - [facility]

## 2023-02-21 MED ORDER — CEPHALEXIN 500 MG PO CAPS: 500.0000 mg | ORAL_CAPSULE | Freq: Two times a day (BID) | ORAL | 0 refills | Status: AC

## 2023-02-21 NOTE — Addendum Note (Signed)
Addended by: Malva Limes on: 02/21/2023 04:47 PM   Modules accepted: Orders

## 2023-02-28 ENCOUNTER — Ambulatory Visit: Payer: Self-pay

## 2023-02-28 NOTE — Patient Outreach (Signed)
  Care Coordination   Initial Visit Note   02/28/2023 Name: Natalie Pena MRN: [id number] DOB: 03-03-1941  Natalie Pena is a 82 y.o. year old female who sees Bacigalupo, Marzella Schlein, MD for primary care. I spoke with  Natalie Pena by phone today.  What matters to the patients health and wellness today?  CM educated patient on [facility] services.  Patient declines services and feels that she is able to manage her medical needs and pharmacy needs.  Patient agreed to contact her provider in the future if she needs [facility] services.    Goals Addressed   None     SDOH assessments and interventions completed:  No     Care Coordination Interventions:  Yes, provided  Interventions Today    Flowsheet Row Most Recent Value  General Interventions   General Interventions Discussed/Reviewed General Interventions Discussed  [No concerns with accessing food or transportation]  Education Interventions   Education Provided Provided Education  [facility] services]  Pharmacy Interventions   Pharmacy Dicussed/Reviewed Pharmacy Topics Discussed  Natalie Pena is currently taking strong rx for UTI and it is effective. No other concerns regarding rx.]        Follow up plan: No further intervention required.   Encounter Outcome:  Pt. Visit Completed

## 2023-03-12 ENCOUNTER — Ambulatory Visit (INDEPENDENT_AMBULATORY_CARE_PROVIDER_SITE_OTHER): Payer: Medicare Other

## 2023-03-12 VITALS — Ht 61.5 in | Wt 135.0 lb

## 2023-03-12 DIAGNOSIS — Z0000 Encounter for general adult medical examination without abnormal findings: Secondary | ICD-10-CM | POA: Diagnosis not present

## 2023-03-12 NOTE — Patient Instructions (Signed)
[name] , Thank you for taking time to come for your Medicare Wellness Visit. I appreciate your ongoing commitment to your health goals. Please review the following plan we discussed and let me know if I can assist you in the future.   Referrals/Orders/Follow-Ups/Clinician Recommendations: none  This is a list of the screening recommended for you and due dates:  Health Maintenance  Topic Date Due   Zoster (Shingles) Vaccine (1 of 2) Never done   COVID-19 Vaccine (5 - 2023-24 season) 03/22/2022   Flu Shot  02/20/2023   Medicare Annual Wellness Visit  03/11/2024   DTaP/Tdap/Td vaccine (3 - Td or Tdap) 12/16/2032   Pneumonia Vaccine  Completed   DEXA scan (bone density measurement)  Completed   HPV Vaccine  Aged Out    Advanced directives: (In Chart) A copy of your advanced directives are scanned into your chart should your provider ever need it.  Next Medicare Annual Wellness Visit scheduled for next year: Yes 03/16/2024 @ 9:15am telephone

## 2023-03-12 NOTE — Progress Notes (Signed)
Subjective:   Natalie Pena is a 82 y.o. female who presents for Medicare Annual (Subsequent) preventive examination.  Visit Complete: Virtual  I connected with  Modesty A Toman on 03/12/23 by a audio enabled telemedicine application and verified that I am speaking with the correct person using two identifiers.  Patient Location: Home  Provider Location: Office/Clinic  I discussed the limitations of evaluation and management by telemedicine. The patient expressed understanding and agreed to proceed.  Vital Signs: Unable to obtain new vitals due to this being a telehealth visit.  Patient Medicare AWV questionnaire was completed by the patient on (not done); I have confirmed that all information answered by patient is correct and no changes since this date.  Review of Systems    Cardiac Risk Factors include: advanced age (>42men, >64 women)    Objective:    Today's Vitals   03/12/23 0923  Weight: 135 lb (61.2 kg)  Height: 5' 1.5" (1.562 m)   Body mass index is 25.09 kg/m.     03/12/2023    9:37 AM 02/27/2022   10:04 AM 03/13/2018    8:46 AM 03/12/2017    2:36 PM 03/13/2016    8:51 AM 01/25/2015    9:16 AM  Advanced Directives  Does Patient Have a Medical Advance Directive? Yes Yes Yes No Yes Yes  Type of Estate agent of [place];Living will Healthcare Power of [place];Living will Healthcare Power of [place];Living will  Living will Living will  Does patient want to make changes to medical advance directive?  Yes (Inpatient - patient defers changing a medical advance directive and declines information at this time)  No - Patient declined    Copy of Healthcare Power of Attorney in Chart?  Yes - validated most recent copy scanned in chart (See row information) Yes       Current Medications (verified) Outpatient Encounter Medications as of 03/12/2023  Medication Sig   aspirin EC 81 MG tablet Take 1 tablet (81 mg total) by mouth daily. Swallow whole.  (Patient taking differently: Take 81 mg by mouth once a week. Swallow whole.)   cholecalciferol (VITAMIN D) 1000 units tablet Take 1,000 Units by mouth daily.   Cholecalciferol (VITAMIN D3) 10 MCG (400 UNIT) tablet Take by mouth.   Flaxseed, Linseed, (FLAX SEED OIL PO) Take 1,200 mg by mouth daily in the afternoon.   ibuprofen (ADVIL,MOTRIN) 200 MG tablet Take 200 mg by mouth every 6 (six) hours as needed.     loratadine (CLARITIN) 10 MG tablet Take 10 mg by mouth daily as needed for allergies.   Magnesium 250 MG TABS Take 250 mg by mouth.   Multiple Vitamin (MULTIVITAMIN) tablet Take 1 tablet by mouth daily.    rosuvastatin (CRESTOR) 5 MG tablet Take 1 tablet (5 mg total) by mouth once a week. (Patient not taking: Reported on 12/02/2022)   No facility-administered encounter medications on file as of 03/12/2023.    Allergies (verified) Shellfish allergy   History: Past Medical History:  Diagnosis Date   Hypercholesterolemia    Osteoporosis    Past Surgical History:  Procedure Laterality Date   APPENDECTOMY     BILATERAL ANTERIOR TOTAL HIP ARTHROPLASTY     CESAREAN SECTION  1973   EYE SURGERY Left 05/2014   cataract surgery   TONSILLECTOMY AND ADENOIDECTOMY  1940   TOTAL ABDOMINAL HYSTERECTOMY  1974   fibroids   unilateral oophorectomy  1974   left   Family History  Problem Relation Age  of Onset   Pancreatic cancer Father    Heart attack Mother    Breast cancer Paternal Grandmother 52   Stroke Sister    Social History   Socioeconomic History   Marital status: Widowed    Spouse name: Not on file   Number of children: 2   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    Social Determinants of Health   Financial Resource Strain: Low Risk  (03/12/2023)   Overall Financial Resource Strain (CARDIA)    Difficulty of Paying Living Expenses: Not hard at all  Food Insecurity: No Food Insecurity (03/12/2023)   Hunger Vital Sign    Worried About Running Out of Food in the Last Year: Never true    Ran Out of Food in the Last Year: Never true  Transportation Needs: No Transportation Needs (03/12/2023)   PRAPARE - Administrator, Civil Service (Medical): No    Lack of Transportation (Non-Medical): No  Physical Activity: Insufficiently Active (03/12/2023)   Exercise Vital Sign    Days of Exercise per Week: 7 days    Minutes of Exercise per Session: 20 min  Stress: No Stress Concern Present (03/12/2023)   [employer] of Occupational Health - Occupational Stress Questionnaire    Feeling of Stress : Not at all  Social Connections: Moderately Isolated (03/12/2023)   Social Connection and Isolation Panel [NHANES]    Frequency of Communication with Friends and Family: More than three times a week    Frequency of Social Gatherings with Friends and Family: Twice a week    Attends Religious Services: More than 4 times per year    Active Member of [employer] or Organizations: No    Attends Banker Meetings: Never    Marital Status: Widowed    Tobacco Counseling Counseling given: Not Answered Tobacco comments: tobacco use - no   Clinical Intake:  Pre-visit preparation completed: Yes  Pain : No/denies pain   BMI - recorded: 25.09 Nutritional Status: BMI 25 -29 Overweight Nutritional Risks: None Diabetes: No  How often do you need to have someone help you when you read instructions, pamphlets, or other written materials from your doctor or pharmacy?: 1 -  Never  Interpreter Needed?: No  Comments: lives alone Information entered by :: B.Dedric Ethington,LPN   Activities of Daily Living    03/12/2023    9:37 AM 02/14/2023   11:34 AM  In your present state of health, do you have any difficulty performing the following activities:  Hearing? 0 0  Vision? 0 0  Difficulty concentrating or making decisions? 0 0  Walking or climbing stairs? 0 0  Dressing or bathing? 0 0  Doing errands, shopping? 0 0  Preparing Food and eating ? N   Using the Toilet? N   In the past six months, have you accidently leaked urine? Y   Do you have problems with loss of bowel control? N  Managing your Medications? N   Managing your Finances? N   Housekeeping or managing your Housekeeping? N     Patient Care Team: Erasmo Downer, MD as PCP - General (Family Medicine) Lockie Mola, MD as Referring Physician (Ophthalmology)  Indicate any recent Medical Services you may have received from other than [facility] providers in the past year (date may be approximate).     Assessment:   This is a routine wellness examination for Remmi.  Hearing/Vision screen Hearing Screening - Comments:: Adequate hearing: hears less in crowds Vision Screening - Comments:: Adequate vision after one (lft) cataract removed  Dr Inez Pilgrim  Dietary issues and exercise activities discussed:     Goals Addressed             This Visit's Progress    DIET - EAT MORE FRUITS AND VEGETABLES   On track    Exercise 150 minutes per week (moderate activity)   On track    Exercise 3x per week (20 min per time)   On track    Recommend exercising (walking) three times a week for 20 minutes.        Depression Screen    03/12/2023    9:33 AM 02/14/2023   11:34 AM 12/02/2022    9:14 AM 05/09/2022    9:39 AM 02/27/2022   10:02 AM 01/24/2022   11:01 AM 02/06/2021    9:52 AM  PHQ 2/9 Scores  PHQ - 2 Score 1 1 0 0 0 0 0  PHQ- 9 Score 2 6 2 4  0 1 3    Fall Risk    03/12/2023    9:26  AM 02/14/2023   11:34 AM 12/02/2022    9:14 AM 05/09/2022    9:39 AM 02/27/2022   10:05 AM  Fall Risk   Falls in the past year? 1 1 1 1 1   Number falls in past yr: 1 1 1 1 1   Injury with Fall? 1 1 1 1  0  Risk for fall due to : No Fall Risks History of fall(s) History of fall(s) History of fall(s) History of fall(s)  Follow up Education provided;Falls prevention discussed Falls evaluation completed Falls evaluation completed Falls evaluation completed;Education provided;Falls prevention discussed Falls evaluation completed;Falls prevention discussed    MEDICARE RISK AT HOME: Medicare Risk at Home Any stairs in or around the home?: Yes If so, are there any without handrails?: Yes Home free of loose throw rugs in walkways, pet beds, electrical cords, etc?: Yes Adequate lighting in your home to reduce risk of falls?: Yes Life alert?: No Use of a cane, walker or w/c?: No Grab bars in the bathroom?: Yes Shower chair or bench in shower?: No Elevated toilet seat or a handicapped toilet?: Yes  TIMED UP AND GO:  Was the test performed?  No    Cognitive Function:        03/12/2023    9:39 AM 02/27/2022   10:08 AM 03/13/2018    8:51 AM  6CIT Screen  What Year? 0 points 0 points 0 points  What month? 0 points 0 points 0 points  What time? 0 points 0 points 0 points  Count back from 20 0 points 0 points 0 points  Months in reverse 0 points 0 points 0 points  Repeat phrase 0 points 2 points 2 points  Total Score 0 points 2 points 2 points    Immunizations Immunization History  Administered Date(s) Administered   Fluad Quad(high Dose 65+) 03/28/2022  Influenza, High Dose Seasonal PF 04/11/2017   Influenza, Seasonal, Injecte, Preservative Fre 03/30/2019   Influenza-Unspecified 05/07/2021   PFIZER Comirnaty(Gray Top)Covid-19 Tri-Sucrose Vaccine 08/25/2019, 09/15/2019, 06/06/2020   PFIZER(Purple Top)SARS-COV-2 Vaccination 02/13/2021   Pneumococcal Conjugate-13 12/29/2013   Pneumococcal  Polysaccharide-23 07/22/2005   Tdap 08/09/2010, 12/17/2022    TDAP status: Up to date  Flu Vaccine status: Up to date  Pneumococcal vaccine status: Up to date  Covid-19 vaccine status: Completed vaccines  Qualifies for Shingles Vaccine? Yes   Zostavax completed No   Shingrix Completed?: No.    Education has been provided regarding the importance of this vaccine. Patient has been advised to call insurance company to determine out of pocket expense if they have not yet received this vaccine. Advised may also receive vaccine at local pharmacy or Health Dept. Verbalized acceptance and understanding.  Screening Tests Health Maintenance  Topic Date Due   Zoster Vaccines- Shingrix (1 of 2) Never done   COVID-19 Vaccine (5 - 2023-24 season) 03/22/2022   INFLUENZA VACCINE  02/20/2023   Medicare Annual Wellness (AWV)  03/11/2024   DTaP/Tdap/Td (3 - Td or Tdap) 12/16/2032   Pneumonia Vaccine 94+ Years old  Completed   DEXA SCAN  Completed   HPV VACCINES  Aged Out    Health Maintenance  Health Maintenance Due  Topic Date Due   Zoster Vaccines- Shingrix (1 of 2) Never done   COVID-19 Vaccine (5 - 2023-24 season) 03/22/2022   INFLUENZA VACCINE  02/20/2023    Colorectal cancer screening: No longer required.   Mammogram status: No longer required due to age.  Bone Density status: Completed yes. Results reflect: Bone density results: OSTEOPOROSIS. Repeat every 3-5 years.  Lung Cancer Screening: (Low Dose CT Chest recommended if Age 31-80 years, 20 pack-year currently smoking OR have quit w/in 15years.) does not qualify.   Lung Cancer Screening Referral: no  Additional Screening:  Hepatitis C Screening: does not qualify; Completed yes  Vision Screening: Recommended annual ophthalmology exams for early detection of glaucoma and other disorders of the eye. Is the patient up to date with their annual eye exam?  Yes  Who is the provider or what is the name of the office in which the  patient attends annual eye exams? Dr Inez Pilgrim If pt is not established with a provider, would they like to be referred to a provider to establish care? No .   Dental Screening: Recommended annual dental exams for proper oral hygiene  Diabetic Foot Exam: n/a  Community Resource Referral / Chronic Care Management: CRR required this visit?  No   CCM required this visit?  No    Plan:     I have personally reviewed and noted the following in the patient's chart:   Medical and social history Use of alcohol, tobacco or illicit drugs  Current medications and supplements including opioid prescriptions. Patient is not currently taking opioid prescriptions. Functional ability and status Nutritional status Physical activity Advanced directives List of other physicians Hospitalizations, surgeries, and ER visits in previous 12 months Vitals Screenings to include cognitive, depression, and falls Referrals and appointments  In addition, I have reviewed and discussed with patient certain preventive protocols, quality metrics, and best practice recommendations. A written personalized care plan for preventive services as well as general preventive health recommendations were provided to patient.    Sue Lush, LPN   10/12/4008   After Visit Summary: (Declined) Due to this being a telephonic visit, with patients personalized plan was offered to  patient but patient Declined AVS at this time   Nurse Notes: The patient states she is doing well and has no concerns or questions at this time.

## 2023-03-19 ENCOUNTER — Ambulatory Visit: Payer: Medicare Other | Admitting: Physician Assistant

## 2023-12-04 ENCOUNTER — Encounter: Payer: Self-pay | Admitting: Family Medicine

## 2023-12-04 ENCOUNTER — Ambulatory Visit (INDEPENDENT_AMBULATORY_CARE_PROVIDER_SITE_OTHER): Payer: Self-pay | Admitting: Family Medicine

## 2023-12-04 VITALS — BP 124/60 | HR 70 | Temp 98.6°F | Ht 63.0 in | Wt 140.4 lb

## 2023-12-04 DIAGNOSIS — I739 Peripheral vascular disease, unspecified: Secondary | ICD-10-CM | POA: Diagnosis not present

## 2023-12-04 DIAGNOSIS — G720 Drug-induced myopathy: Secondary | ICD-10-CM | POA: Diagnosis not present

## 2023-12-04 DIAGNOSIS — M810 Age-related osteoporosis without current pathological fracture: Secondary | ICD-10-CM | POA: Diagnosis not present

## 2023-12-04 DIAGNOSIS — Z0001 Encounter for general adult medical examination with abnormal findings: Secondary | ICD-10-CM | POA: Diagnosis not present

## 2023-12-04 DIAGNOSIS — R7303 Prediabetes: Secondary | ICD-10-CM

## 2023-12-04 DIAGNOSIS — T466X5A Adverse effect of antihyperlipidemic and antiarteriosclerotic drugs, initial encounter: Secondary | ICD-10-CM

## 2023-12-04 DIAGNOSIS — E7800 Pure hypercholesterolemia, unspecified: Secondary | ICD-10-CM

## 2023-12-04 DIAGNOSIS — Z0000 Encounter for general adult medical examination without abnormal findings: Secondary | ICD-10-CM

## 2023-12-04 DIAGNOSIS — G2581 Restless legs syndrome: Secondary | ICD-10-CM

## 2023-12-04 DIAGNOSIS — E559 Vitamin D deficiency, unspecified: Secondary | ICD-10-CM | POA: Diagnosis not present

## 2023-12-04 NOTE — Progress Notes (Signed)
 Complete physical exam   Patient: JANEY MONTANTES   DOB: 1940/12/06   83 y.o. Female  MRN: [id number] Visit Date: 12/04/2023  Today's healthcare provider: Aden Agreste, MD   Chief Complaint  Patient presents with   Annual Exam    Last AWV completed 03/12/23 Last CPE completed 12/02/22 Diet -  None Exercise -  Everyday 20 mins, walking and stretching with resistance bands. Feeling -  Quite well Sleeping -  A little bit of problems, either can't fall asleep or get up around 1 am and can't go back to sleep.  Reports a lot going on her head and she can't turn her brain off.  Also when restless leg syndrome flares up it interrupts her sleep uses compression socks to have comfort. Concerns -  None   Subjective    Amberly A Och is a 83 y.o. female who presents today for a complete physical exam.   Discussed the use of AI scribe software for clinical note transcription with the patient, who gave verbal consent to proceed.  History of Present Illness   Maecy A Garlow is an 83 year old female who presents for a routine follow-up and vaccination review.  She has received her tetanus shot and has documentation of a shingles vaccination from 2007 but has not received the newer shingles vaccine. She experienced congestion and used Mucinex DM, noting unusual chest sounds during this period. She discontinued her cholesterol medication due to muscle pain after a fall and prefers to avoid medications unless necessary. She experiences restless leg syndrome, which sometimes disrupts her sleep, and uses Nyquil occasionally for sleep. Compression sleeves on her legs are beneficial. Her social activities are limited to grocery shopping and attending church, and she feels anxious with unfamiliar activities or long-distance travel.        Last depression screening scores    12/04/2023    9:30 AM 03/12/2023    9:33 AM 02/14/2023   11:34 AM  PHQ 2/9 Scores  PHQ - 2 Score 0 1 1  PHQ- 9 Score  4 2 6    Last fall risk screening    03/12/2023    9:26 AM  Fall Risk   Falls in the past year? 1  Number falls in past yr: 1  Injury with Fall? 1  Risk for fall due to : No Fall Risks  Follow up Education provided;Falls prevention discussed        Medications: Outpatient Medications Prior to Visit  Medication Sig   aspirin  EC 81 MG tablet Take 1 tablet (81 mg total) by mouth daily. Swallow whole. (Patient taking differently: Take 81 mg by mouth once a week. Swallow whole.)   cholecalciferol (VITAMIN D ) 1000 units tablet Take 1,000 Units by mouth daily.   Cholecalciferol (VITAMIN D3) 10 MCG (400 UNIT) tablet Take by mouth.   Flaxseed, Linseed, (FLAX SEED OIL PO) Take 1,200 mg by mouth daily in the afternoon.   ibuprofen (ADVIL,MOTRIN) 200 MG tablet Take 200 mg by mouth every 6 (six) hours as needed.     loratadine (CLARITIN) 10 MG tablet Take 10 mg by mouth daily as needed for allergies.   Magnesium 250 MG TABS Take 250 mg by mouth.   Multiple Vitamin (MULTIVITAMIN) tablet Take 1 tablet by mouth daily.    [DISCONTINUED] rosuvastatin  (CRESTOR ) 5 MG tablet Take 1 tablet (5 mg total) by mouth once a week. (Patient not taking: Reported on 12/04/2023)   No facility-administered medications prior to visit.  Review of Systems    Objective    BP 124/60 (BP Location: Left Arm, Patient Position: Sitting, Cuff Size: Large)   Pulse 70   Temp 98.6 F (37 C) (Oral)   Ht 5\' 3"  (1.6 m)   Wt 140 lb 6.4 oz (63.7 kg)   SpO2 98%   BMI 24.87 kg/m    Physical Exam Vitals reviewed.  Constitutional:      General: She is not in acute distress.    Appearance: Normal appearance. She is well-developed. She is not diaphoretic.  HENT:     Head: Normocephalic and atraumatic.     Right Ear: External ear normal.     Left Ear: External ear normal.     Nose: Nose normal.     Mouth/Throat:     Mouth: Mucous membranes are moist.     Pharynx: Oropharynx is clear. No oropharyngeal exudate.   Eyes:     General: No scleral icterus.    Conjunctiva/sclera: Conjunctivae normal.  Neck:     Thyroid : No thyromegaly.  Cardiovascular:     Rate and Rhythm: Normal rate and regular rhythm.     Heart sounds: Normal heart sounds. No murmur heard. Pulmonary:     Effort: Pulmonary effort is normal. No respiratory distress.     Breath sounds: Normal breath sounds. No wheezing or rales.  Abdominal:     General: There is no distension.     Palpations: Abdomen is soft.     Tenderness: There is no abdominal tenderness.  Musculoskeletal:        General: No deformity.     Cervical back: Neck supple.     Right lower leg: No edema.     Left lower leg: No edema.  Lymphadenopathy:     Cervical: No cervical adenopathy.  Skin:    General: Skin is warm and dry.     Findings: No rash.  Neurological:     Mental Status: She is alert and oriented to person, place, and time. Mental status is at baseline.     Gait: Gait normal.  Psychiatric:        Mood and Affect: Mood normal.        Behavior: Behavior normal.        Thought Content: Thought content normal.      No results found for any visits on 12/04/23.  Assessment & Plan    Routine Health Maintenance and Physical Exam  Exercise Activities and Dietary recommendations  Goals      DIET - EAT MORE FRUITS AND VEGETABLES     Exercise 150 minutes per week (moderate activity)     Exercise 3x per week (20 min per time)     Recommend exercising (walking) three times a week for 20 minutes.         Immunization History  Administered Date(s) Administered   Fluad Quad(high Dose 65+) 03/28/2022   Influenza, High Dose Seasonal PF 04/11/2017   Influenza, Seasonal, Injecte, Preservative Fre 03/30/2019   Influenza-Unspecified 05/07/2021   PFIZER Comirnaty(Gray Top)Covid-19 Tri-Sucrose Vaccine 08/25/2019, 09/15/2019, 06/06/2020, 02/13/2021   PFIZER(Purple Top)SARS-COV-2 Vaccination 08/25/2019, 09/15/2019, 06/06/2020, 02/13/2021   Pneumococcal  Conjugate-13 12/29/2013   Pneumococcal Polysaccharide-23 07/22/2005   Tdap 08/09/2010, 12/17/2022    Health Maintenance  Topic Date Due   Zoster Vaccines- Shingrix (1 of 2) Never done   COVID-19 Vaccine (9 - 2024-25 season) 03/23/2023   INFLUENZA VACCINE  02/20/2024   Medicare Annual Wellness (AWV)  03/11/2024   DTaP/Tdap/Td (3 - Td  or Tdap) 12/16/2032   Pneumonia Vaccine 62+ Years old  Completed   DEXA SCAN  Completed   HPV VACCINES  Aged Out   Meningococcal B Vaccine  Aged Out    Discussed health benefits of physical activity, and encouraged her to engage in regular exercise appropriate for her age and condition.  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 No specific concerns or changes discussed during the visit. - Order laboratory tests including A1c to monitor prediabetes status.      Relevant Orders   Hemoglobin A1c   Other Visit Diagnoses       Encounter for annual physical exam    -  Primary     Restless legs               Restless legs syndrome Intermittent restless legs syndrome exacerbated by Nyquil,  which contains anticholinergic properties that can worsen symptoms. She experiences difficulty sleeping and uses compression sleeves for relief. Nyquil is used for sleep but may worsen symptoms and has long-term side effects, including memory impairment. - Switch from current magnesium supplement to magnesium glycinate, 30 minutes before bedtime, to aid sleep and potentially alleviate restless legs. - Advise against using Nyquil due to its potential to worsen restless legs and its long-term side effects, including memory impairment. - Discuss potential prescription medications for restless legs if symptoms worsen or do not improve with magnesium glycinate.  General Health Maintenance Immunizations and routine screenings are up to date. She received a tetanus shot and is due for a shingles vaccine. The new shingles vaccine is 95% effective compared to the old one, which was 50% effective. She is advised to check on the RSV vaccine status. - Consider receiving the new shingles vaccine at the pharmacy, as it is 95% effective. - Check with the pharmacy regarding RSV vaccine status and consider receiving it if not already done. - Flu shot to be administered in the fall.        Return in about 1 year (around 12/03/2024) for CPE.     Aden Agreste, MD  [facility] [phone] (phone) [phone] (fax)  [facility]

## 2023-12-04 NOTE — Assessment & Plan Note (Signed)
Patient did not tolerate Crestor We had discussed weekly dosing, but she discontinued Reviewed last lipid panel Not currently on a statin Recheck FLP and CMP Discussed diet and exercise

## 2023-12-04 NOTE — Assessment & Plan Note (Signed)
 Routine monitoring of prediabetes status. No specific concerns or changes discussed during the visit. - Order laboratory tests including A1c to monitor prediabetes status.

## 2023-12-04 NOTE — Assessment & Plan Note (Signed)
No longer on statin  

## 2023-12-04 NOTE — Assessment & Plan Note (Signed)
Declines repeat DEXA as she would not treat despite results Encourage Ca and Vit D supplementation and weight bearing exercise 

## 2023-12-04 NOTE — Addendum Note (Signed)
 Addended by: Mazie Speed on: 12/04/2023 01:51 PM   Modules accepted: Level of Service

## 2023-12-04 NOTE — Assessment & Plan Note (Signed)
 Intolerant to statin.

## 2023-12-05 ENCOUNTER — Ambulatory Visit: Payer: Self-pay | Admitting: Family Medicine

## 2023-12-05 LAB — LIPID PANEL
Chol/HDL Ratio: 2.3 ratio (ref 0.0–4.4)
Cholesterol, Total: 239 mg/dL — ABNORMAL HIGH (ref 100–199)
HDL: 105 mg/dL (ref >39)
LDL Chol Calc (NIH): 120 mg/dL — ABNORMAL HIGH (ref 0–99)
Triglycerides: 82 mg/dL (ref 0–149)
VLDL Cholesterol Cal: 14 mg/dL (ref 5–40)

## 2023-12-05 LAB — COMPREHENSIVE METABOLIC PANEL WITH GFR
ALT: 11 IU/L (ref 0–32)
AST: 15 IU/L (ref 0–40)
Albumin: 4.5 g/dL (ref 3.7–4.7)
Alkaline Phosphatase: 61 IU/L (ref 44–121)
BUN/Creatinine Ratio: 19 (ref 12–28)
BUN: 12 mg/dL (ref 8–27)
Bilirubin Total: 0.4 mg/dL (ref 0.0–1.2)
CO2: 23 mmol/L (ref 20–29)
Calcium: 9.5 mg/dL (ref 8.7–10.3)
Chloride: 100 mmol/L (ref 96–106)
Creatinine, Ser: 0.62 mg/dL (ref 0.57–1.00)
Globulin, Total: 2 g/dL (ref 1.5–4.5)
Glucose: 103 mg/dL — ABNORMAL HIGH (ref 70–99)
Potassium: 4.5 mmol/L (ref 3.5–5.2)
Sodium: 138 mmol/L (ref 134–144)
Total Protein: 6.5 g/dL (ref 6.0–8.5)
eGFR: 88 mL/min/{1.73_m2} (ref >59)

## 2023-12-05 LAB — HEMOGLOBIN A1C
Est. average glucose Bld gHb Est-mCnc: 123 mg/dL
Hgb A1c MFr Bld: 5.9 % — ABNORMAL HIGH (ref 4.8–5.6)

## 2023-12-05 LAB — VITAMIN D 25 HYDROXY (VIT D DEFICIENCY, FRACTURES): Vit D, 25-Hydroxy: 44.8 ng/mL (ref 30.0–100.0)

## 2023-12-11 ENCOUNTER — Ambulatory Visit: Payer: Self-pay

## 2023-12-11 NOTE — Telephone Encounter (Signed)
 Pt calling for lab results, this RN advised per MD, pt verbalized understanding and agrees to plan. Pt would like to stop in to office today to pick up printed copies of lab results for her records.  Copied from CRM [id number]. Topic: Clinical - Lab/Test Results >> Dec 11, 2023  9:04 AM Rosamond Comes wrote: Reason for CRM: patient calling asking to pick up a copy of recent lab results, this morning. Patient will be in around 10-11 Patient would like a call from the nurse regarding lab results. Reason for Disposition  Health Information question, no triage required and triager able to answer question  Answer Assessment - Initial Assessment Questions 1. REASON FOR CALL or QUESTION: "What is your reason for calling today?" or "How can I best help you?" or "What question do you have that I can help answer?"     Results  Protocols used: Information Only Call - No Triage-A-AH

## 2024-01-01 ENCOUNTER — Ambulatory Visit: Payer: Self-pay

## 2024-01-01 NOTE — Telephone Encounter (Signed)
 Ok can anyone see her virtually tomorrow? Maybe [facility] if not up here

## 2024-01-01 NOTE — Telephone Encounter (Signed)
 Does she have anyone that could get a urine cup and then bring it back with a sample of hers so we could send a culture?  We could do that and do a virtual visit (I could double book that this afternoon if needed). Really want to be able to send a urine culture off in case she doesn't get better with antibiotics.

## 2024-01-01 NOTE — Telephone Encounter (Signed)
 FYI Only or Action Required?: Action required by provider  Patient was last seen in primary care on 12/04/2023 by Mazie Speed, MD. Called Nurse Triage reporting Dysuria. Symptoms began several days ago. Interventions attempted: OTC medications: AZO and Rest, hydration, or home remedies. Symptoms are: burning with urination, urinary frequency, urinary retentiongradually worsening.  Triage Disposition: See HCP Within 4 Hours (Or PCP Triage)- Refused urgent care. No available appts at [facility].  Patient/caregiver understands and will follow disposition?: No, wishes to speak with PCP                   Copied from CRM #[id number]. Topic: Clinical - Red Word Triage >> Jan 01, 2024  8:04 AM Natalie Pena wrote: Red Word that prompted transfer to Nurse Triage: The patient states she has a severe UTI and she is in severe pain. She is requesting to see if medication can be called in for her. She is afraid to go anywhere as it is also causing urinary incontinence. Reason for Disposition  [1] SEVERE pain with urination (e.g., excruciating) AND [2] not improved after 2 hours of pain medicine and Sitz bath  Answer Assessment - Initial Assessment Questions 1. SEVERITY: How bad is the pain?  (e.g., Scale 1-10; mild, moderate, or severe)   - MILD (1-3): complains slightly about urination hurting   - MODERATE (4-7): interferes with normal activities     - SEVERE (8-10): excruciating, unwilling or unable to urinate because of the pain      8/10.  2. FREQUENCY: How many times have you had painful urination today?      4-5 times.  3. PATTERN: Is pain present every time you urinate or just sometimes?      Yes.  4. ONSET: When did the painful urination start?      Tuesday.  5. FEVER: Do you have a fever? If Yes, ask: What is your temperature, how was it measured, and when did it start?     Unsure, she has not checked but does not feel feverish.  6. PAST UTI: Have you had a  urine infection before? If Yes, ask: When was the last time? and What happened that time?      Yes, about a year ago. She states Dr Shann Darnel prescribed her cephalexin  for 5 days and then she needed a second antibiotic.  7. CAUSE: What do you think is causing the painful urination?  (e.g., UTI, scratch, Herpes sore)     UTI.  8. OTHER SYMPTOMS: Do you have any other symptoms? (e.g., blood in urine, flank pain, genital sores, urgency, vaginal discharge)     Urinary frequency, burning with urination, urinary retention.  9. PREGNANCY: Is there any chance you are pregnant? When was your last menstrual period?     N/A.  Patient denies blood in urine, nausea, vomiting, flank or back pain. Patient states she did an OTC AZO test and states it turned purple which is positive for infection. No available appointments at [facility], recommended patient go to urgent care. She states she doesn't want to wait at a urgent care and wants her PCP to send in an antibiotic for her instead.  Protocols used: Urination Pain - Female-A-AH

## 2024-01-02 ENCOUNTER — Telehealth: Admitting: Family Medicine

## 2024-01-02 DIAGNOSIS — N390 Urinary tract infection, site not specified: Secondary | ICD-10-CM | POA: Diagnosis not present

## 2024-01-02 DIAGNOSIS — R300 Dysuria: Secondary | ICD-10-CM | POA: Diagnosis not present

## 2024-01-02 NOTE — Telephone Encounter (Signed)
 Appt made for mychart visit with Lavone Power at 11:20. Left voicemail for patient.

## 2024-01-21 DIAGNOSIS — R300 Dysuria: Secondary | ICD-10-CM | POA: Diagnosis not present

## 2024-01-27 ENCOUNTER — Ambulatory Visit: Payer: Self-pay

## 2024-01-27 NOTE — Telephone Encounter (Signed)
 FYI Only or Action Required?: FYI only for provider.  Patient was last seen in primary care on 12/04/2023 by Myrla Jon HERO, MD.  Called Nurse Triage reporting Recurrent UTI.  Symptoms began about a month ago.  Interventions attempted: Nothing.  Symptoms are: gradually worsening.  Triage Disposition: See Physician Within 24 Hours  Patient/caregiver understands and will follow disposition?: Yes      Copied from CRM [id number]. Topic: Clinical - Red Word Triage >> Jan 27, 2024  8:11 AM Avram MATSU wrote: Red Word that prompted transfer to Nurse Triage: uti infection Reason for Disposition  [1] Taking antibiotic > 24 hours for UTI (urinary tract or bladder infection) AND [2] fever persists (still has fever)  Answer Assessment - Initial Assessment Questions 1. MAIN SYMPTOM: What is the main symptom you are concerned about? (e.g., painful urination, urine frequency)     Frequency, urgency and painful urination 2. BETTER-SAME-WORSE: Are you getting better, staying the same, or getting worse compared to how you felt at your last visit to the doctor (most recent medical visit)?     States was at [facility] was given an antibiotic and it didn't help with the UTI, then was put on another antibiotic, states still having symptoms 3. PAIN: How bad is the pain?  (e.g., Scale 1-10; mild, moderate, or severe)   - MILD (1-3): complains slightly about urination hurting   - MODERATE (4-7): interferes with normal activities     - SEVERE (8-10): excruciating, unwilling or unable to urinate because of the pain      moderate 4. FEVER: Do you have a fever? If Yes, ask: What is it, how was it measured, and when did it start?     no 5. OTHER SYMPTOMS: Do you have any other symptoms? (e.g., blood in the urine, flank pain, vaginal discharge)     denies 6. DIAGNOSIS: When was the UTI diagnosed? By whom? Was it a kidney infection, bladder infection or both?     yes 7. ANTIBIOTIC: What  antibiotic(s) are you taking? How many times per day?     Yes, see list 8. ANTIBIOTIC - START DATE: When did you start taking the antibiotic?      June 13  Protocols used: Urinary Tract Infection on Antibiotic Follow-up Call - [facility]

## 2024-01-28 ENCOUNTER — Encounter: Payer: Self-pay | Admitting: Family Medicine

## 2024-01-28 ENCOUNTER — Ambulatory Visit (INDEPENDENT_AMBULATORY_CARE_PROVIDER_SITE_OTHER): Admitting: Family Medicine

## 2024-01-28 VITALS — BP 125/73 | HR 77 | Ht 63.0 in | Wt 138.0 lb

## 2024-01-28 DIAGNOSIS — R300 Dysuria: Secondary | ICD-10-CM | POA: Diagnosis not present

## 2024-01-28 DIAGNOSIS — N952 Postmenopausal atrophic vaginitis: Secondary | ICD-10-CM | POA: Diagnosis not present

## 2024-01-28 LAB — POCT URINALYSIS DIPSTICK
Bilirubin, UA: NEGATIVE
Blood, UA: NEGATIVE
Glucose, UA: NEGATIVE
Ketones, UA: NEGATIVE
Leukocytes, UA: NEGATIVE
Nitrite, UA: NEGATIVE
Protein, UA: POSITIVE — AB
Spec Grav, UA: 1.02 (ref 1.010–1.025)
Urobilinogen, UA: 2 U/dL — AB
pH, UA: 6.5 (ref 5.0–8.0)

## 2024-01-28 MED ORDER — ESTRADIOL 0.1 MG/GM VA CREA
1.0000 | TOPICAL_CREAM | Freq: Every day | VAGINAL | 12 refills | Status: AC
Start: 2024-01-28 — End: ?

## 2024-01-28 NOTE — Progress Notes (Signed)
 ACUTE VISIT   Patient: Natalie Pena   DOB: [birth year]   83 y.o. Female  MRN: [id number]   PCP: Myrla Jon HERO, MD  Chief Complaint  Patient presents with   Urinary Tract Infection    Burning pain when she urinate this has been going on since the beginning of June this has been off and on since    Subjective    HPI HPI     Urinary Tract Infection    Additional comments: Burning pain when she urinate this has been going on since the beginning of June this has been off and on since       Last edited by Thelbert Eulalio HERO, CMA on 01/28/2024  8:18 AM.       Discussed the use of AI scribe software for clinical note transcription with the patient, who gave verbal consent to proceed.  History of Present Illness Natalie Pena is an 83 year old female who presents with urinary discomfort.  She experiences a burning discomfort primarily in the vaginal area, most pronounced at night, which affects her sleep. The pain is constant and not limited to urination. She initially suspected a urinary tract infection, but urgent care tests did not show significant bacteria in her urine.  She uses Azo for temporary relief, but it does not address the underlying issue. Advil has not alleviated the symptoms. No fever or chills, though she notes her temperature gauges are unreliable. No itching, only burning pain.  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 She retains her right ovary.  She cares for her six-year-old great-grandchild in the afternoons and avoids driving with her in the car for safety reasons.     Medications: Outpatient Medications Prior to Visit  Medication Sig   aspirin  EC 81 MG tablet Take 1 tablet (81 mg total) by mouth daily. Swallow whole. (Patient taking differently: Take 81 mg by mouth once a week. Swallow whole.)   cholecalciferol (VITAMIN D ) 1000 units tablet Take 1,000 Units by  mouth daily.   Cholecalciferol (VITAMIN D3) 10 MCG (400 UNIT) tablet Take by mouth.   Flaxseed, Linseed, (FLAX SEED OIL PO) Take 1,200 mg by mouth daily in the afternoon.   ibuprofen (ADVIL,MOTRIN) 200 MG tablet Take 200 mg by mouth every 6 (six) hours as needed.     loratadine (CLARITIN) 10 MG tablet Take 10 mg by mouth daily as needed for allergies.   Magnesium 250 MG TABS Take 250 mg by mouth.   Multiple Vitamin (MULTIVITAMIN) tablet Take 1 tablet by mouth daily.    No facility-administered medications prior to visit.   Lab Results  Component Value Date   COLORU yellow 02/14/2023   CLARITYU clear 02/14/2023   GLUCOSEUR Negative 02/14/2023   BILIRUBINUR neg 02/14/2023   KETONESU neg 02/14/2023   SPECGRAV <=1.005 (A) 02/14/2023   RBCUR neg 02/14/2023   PHUR 7.5 02/14/2023   PROTEINUR Negative 02/14/2023   UROBILINOGEN 0.2 02/14/2023   LEUKOCYTESUR Negative 02/14/2023   No results found for the last 90 days.   Last CBC Lab Results  Component Value Date   WBC 7.0 01/25/2022   HGB 14.3 01/25/2022   HCT 42.5 01/25/2022   MCV 87 01/25/2022   MCH 29.2 01/25/2022   RDW 13.3 01/25/2022   PLT 270 01/25/2022   Last metabolic panel Lab Results  Component Value Date   GLUCOSE 103 (H)  12/04/2023   NA 138 12/04/2023   K 4.5 12/04/2023   CL 100 12/04/2023   CO2 23 12/04/2023   BUN 12 12/04/2023   CREATININE 0.62 12/04/2023   EGFR 88 12/04/2023   CALCIUM  9.5 12/04/2023   PROT 6.5 12/04/2023   ALBUMIN 4.5 12/04/2023   LABGLOB 2.0 12/04/2023   AGRATIO 1.9 12/02/2022   BILITOT 0.4 12/04/2023   ALKPHOS 61 12/04/2023   AST 15 12/04/2023   ALT 11 12/04/2023   ANIONGAP 3 (L) 11/04/2013        Objective    BP 125/73   Pulse 77   Ht 5' 3 (1.6 m)   Wt 138 lb (62.6 kg)   SpO2 97%   BMI 24.45 kg/m  BP Readings from Last 3 Encounters:  01/28/24 125/73  12/04/23 124/60  02/14/23 (!) 155/71   Wt Readings from Last 3 Encounters:  01/28/24 138 lb (62.6 kg)  12/04/23 140  lb 6.4 oz (63.7 kg)  03/12/23 135 lb (61.2 kg)      Physical Exam    Physical Exam GENERAL: No acute distress. CARDIOVASCULAR: Regular cardiac rhythm. ABDOMEN: Abdomen soft, non-distended. No suprapubic tenderness. No costovertebral angle tenderness.   No results found for any visits on 01/28/24.  Assessment & Plan      Assessment & Plan Vaginal Atrophy Presents with burning pain in the vaginal area, particularly at night, not associated with urination. 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 Previous use of Azo and Advil provided limited relief. Estrogen deficiency likely causes dryness and irritation of vaginal tissue, leading to discomfort. Vaginal Estrace  cream is recommended to address these symptoms. Discussed that many women experience similar symptoms and that the cream is generally available at pharmacies. - Prescribe vaginal Estrace  cream to be applied nightly for two weeks, then twice a week. - Instruct to apply the cream with an applicator, ensuring some is applied around the urethra to alleviate irritation. - Order a urine culture to rule out any infectious causes.      No follow-ups on file.        Rockie Agent, MD  [facility] [phone] (phone) [phone] (fax)  [facility]

## 2024-01-30 ENCOUNTER — Ambulatory Visit: Payer: Self-pay | Admitting: Family Medicine

## 2024-01-30 ENCOUNTER — Telehealth: Payer: Self-pay

## 2024-01-30 LAB — URINE CULTURE

## 2024-01-30 NOTE — Telephone Encounter (Signed)
 Copied from CRM [id number]. Topic: Clinical - Medication Question >> Jan 29, 2024  9:48 AM Nathanel BROCKS wrote: Reason for CRM:   Ref:estradiol  (ESTRACE  VAGINAL) 0.1 MG/GM vaginal cream   Pt was prescribed this yesterday and she is worried about the side effects, could you please give her a call.

## 2024-01-30 NOTE — Telephone Encounter (Signed)
 She may not experience the side effects, I would recommend trying the medication first to see if it improves her symptoms.    If she is not comfortable, I recommend seeing uro/gyn and can place a referral. As an alternative she can try lubricating gels every morning.

## 2024-01-30 NOTE — Telephone Encounter (Signed)
 Clarification on the urine sample, I recommend patient be seen for an OV and recollect the urine sample if symptoms persist after trying the vaginal estrogen cream as directed.   I do not recommend that she drop off urine sample.

## 2024-01-30 NOTE — Telephone Encounter (Signed)
 Called and spoke to the pt she stated she would try the medication 1st before the referrals are placed, she did want to know if she still needed to bring in the urine sample that was recommended

## 2024-03-16 ENCOUNTER — Ambulatory Visit (INDEPENDENT_AMBULATORY_CARE_PROVIDER_SITE_OTHER): Payer: Medicare Other

## 2024-03-16 DIAGNOSIS — Z0000 Encounter for general adult medical examination without abnormal findings: Secondary | ICD-10-CM | POA: Diagnosis not present

## 2024-03-16 NOTE — Patient Instructions (Addendum)
 Natalie Pena , Thank you for taking time out of your busy schedule to complete your Annual Wellness Visit with me. I enjoyed our conversation and look forward to speaking with you again next year. I, as well as your care team,  appreciate your ongoing commitment to your health goals. Please review the following plan we discussed and let me know if I can assist you in the future.   Follow up Visits: 03/22/25 @ 8:50 AM BY PHONE We will see or speak with you next year for your Next Medicare AWV with our clinical staff Have you seen your provider in the last 6 months (3 months if uncontrolled diabetes)? No  Clinician Recommendations:  Aim for 30 minutes of exercise or brisk walking, 6-8 glasses of water, and 5 servings of fruits and vegetables each day. TAKE CARE!      This is a list of the screenings recommended for you:  Health Maintenance  Topic Date Due   Zoster (Shingles) Vaccine (1 of 2) Never done   COVID-19 Vaccine (9 - 2024-25 season) 03/23/2023   Flu Shot  02/20/2024   Medicare Annual Wellness Visit  03/16/2025   DTaP/Tdap/Td vaccine (3 - Td or Tdap) 12/16/2032   Pneumococcal Vaccine for age over 49  Completed   DEXA scan (bone density measurement)  Completed   HPV Vaccine  Aged Out   Meningitis B Vaccine  Aged Out    Advanced directives: (In Chart) A copy of your advanced directives are scanned into your chart should your provider ever need it. Advance Care Planning is important because it:  [x]  Makes sure you receive the medical care that is consistent with your values, goals, and preferences  [x]  It provides guidance to your family and loved ones and reduces their decisional burden about whether or not they are making the right decisions based on your wishes.  Follow the link provided in your after visit summary or read over the paperwork we have mailed to you to help you started getting your Advance Directives in place. If you need assistance in completing these, please reach  out to us  so that we can help you!

## 2024-03-16 NOTE — Progress Notes (Signed)
 Subjective:   Natalie Pena is a 83 y.o. who presents for a Medicare Wellness preventive visit.  As a reminder, Annual Wellness Visits don't include a physical exam, and some assessments may be limited, especially if this visit is performed virtually. We may recommend an in-person follow-up visit with your provider if needed.  Visit Complete: Virtual I connected with  Natalie Pena on 03/16/24 by a audio enabled telemedicine application and verified that I am speaking with the correct person using two identifiers.  Patient Location: Home  Provider Location: Office/Clinic  I discussed the limitations of evaluation and management by telemedicine. The patient expressed understanding and agreed to proceed.  Vital Signs: Because this visit was a virtual/telehealth visit, some criteria may be missing or patient reported. Any vitals not documented were not able to be obtained and vitals that have been documented are patient reported.  VideoDeclined- This patient declined Librarian, academic. Therefore the visit was completed with audio only.  Persons Participating in Visit: Patient.  AWV Questionnaire: No: Patient Medicare AWV questionnaire was not completed prior to this visit.  Cardiac Risk Factors include: advanced age (>44men, >92 women);dyslipidemia     Objective:    Today's Vitals   03/16/24 0854  PainSc: 0-No pain   There is no height or weight on file to calculate BMI.     03/16/2024    9:03 AM 03/12/2023    9:37 AM 02/27/2022   10:04 AM 03/13/2018    8:46 AM 03/12/2017    2:36 PM 03/13/2016    8:51 AM 01/25/2015    9:16 AM  Advanced Directives  Does Patient Have a Medical Advance Directive? Yes Yes Yes Yes  No  Yes  Yes   Type of Estate agent of [place];Living will Healthcare Power of [place];Living will Healthcare Power of [place];Living will Healthcare Power of [place];Living will  Living will  Living will   Does  patient want to make changes to medical advance directive? No - Patient declined  Yes (Inpatient - patient defers changing a medical advance directive and declines information at this time)  No - Patient declined     Copy of Healthcare Power of Attorney in Chart? Yes - validated most recent copy scanned in chart (See row information)  Yes - validated most recent copy scanned in chart (See row information) Yes         Data saved with a previous flowsheet row definition    Current Medications (verified) Outpatient Encounter Medications as of 03/16/2024  Medication Sig   aspirin  EC 81 MG tablet Take 1 tablet (81 mg total) by mouth daily. Swallow whole. (Patient taking differently: Take 81 mg by mouth once a week. Swallow whole.)   cholecalciferol (VITAMIN D ) 1000 units tablet Take 1,000 Units by mouth daily. (Patient taking differently: Take 2,000 Units by mouth daily.)   estradiol  (ESTRACE  VAGINAL) 0.1 MG/GM vaginal cream Place 1 Applicatorful vaginally at bedtime. Apply 0.5 g of cream into the vagina  once nightly for 2 weeks, then reduce to twice weekly (Patient taking differently: Place 1 Applicatorful vaginally at bedtime. Apply 0.5 g of cream into the vagina  once nightly for 2 weeks, then reduce to twice weekly TAKING EVERY OTHER DAY)   Flaxseed, Linseed, (FLAX SEED OIL PO) Take 1,200 mg by mouth daily in the afternoon.   ibuprofen (ADVIL,MOTRIN) 200 MG tablet Take 200 mg by mouth every 6 (six) hours as needed.     loratadine (CLARITIN) 10  MG tablet Take 10 mg by mouth daily as needed for allergies.   Magnesium 250 MG TABS Take 250 mg by mouth.   Multiple Vitamin (MULTIVITAMIN) tablet Take 1 tablet by mouth daily.    Cholecalciferol (VITAMIN D3) 10 MCG (400 UNIT) tablet Take by mouth. (Patient not taking: Reported on 03/16/2024)   No facility-administered encounter medications on file as of 03/16/2024.    Allergies (verified) Shellfish allergy   History: Past Medical History:  Diagnosis  Date   Hypercholesterolemia    Osteoporosis    Past Surgical History:  Procedure Laterality Date   APPENDECTOMY     BILATERAL ANTERIOR TOTAL HIP ARTHROPLASTY     CESAREAN SECTION  1973   EYE SURGERY Left 05/2014   cataract surgery   TONSILLECTOMY AND ADENOIDECTOMY  1940   TOTAL ABDOMINAL HYSTERECTOMY  1974   fibroids   unilateral oophorectomy  1974   left   Family History  Problem Relation Age of Onset   Pancreatic cancer Father    Heart attack Mother    Breast cancer Paternal Grandmother 2   Stroke Sister    Social History   Socioeconomic History   Marital status: Widowed    Spouse name: Not on file   Number of children: 2   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    Social Drivers of Corporate investment banker Strain: Low Risk  (03/16/2024)   Overall Financial Resource Strain (CARDIA)    Difficulty of Paying Living Expenses: Not hard at all  Food Insecurity: No Food Insecurity (03/16/2024)   Hunger Vital Sign    Worried About Running Out of Food in the Last Year: Never true    Ran Out of Food in the Last Year: Never true  Transportation Needs: No Transportation Needs (03/16/2024)   PRAPARE - Administrator, Civil Service (Medical): No    Lack of Transportation (Non-Medical): No  Physical Activity: Insufficiently Active (03/16/2024)   Exercise Vital Sign    Days of Exercise per Week: 7 days    Minutes of Exercise per Session: 20 min  Stress: No Stress Concern Present  (03/16/2024)   [employer] of Occupational Health - Occupational Stress Questionnaire    Feeling of Stress: Not at all  Social Connections: Moderately Isolated (03/16/2024)   Social Connection and Isolation Panel    Frequency of Communication with Friends and Family: More than three times a week    Frequency of Social Gatherings with Friends and Family: Twice a week    Attends Religious Services: More than 4 times per year    Active Member of [employer] or Organizations: No    Attends Banker Meetings: Never    Marital Status: Widowed    Tobacco Counseling Counseling given: Not Answered Tobacco comments: tobacco use - no    Clinical Intake:  Pre-visit preparation completed: Yes  Pain : No/denies pain Pain Score: 0-No pain  BMI - recorded: 24.4 Nutritional Status: BMI of 19-24  Normal Nutritional Risks: None Diabetes: No  Lab Results  Component Value Date   HGBA1C 5.9 (H) 12/04/2023   HGBA1C 5.8 (H) 12/02/2022   HGBA1C 5.6 02/06/2021     How often do you need to have someone help you when you read instructions, pamphlets, or other written materials from your doctor or pharmacy?: 1 - Never  Interpreter Needed?: No  Information entered by :: JHONNIE DAS, LPN   Activities of Daily Living    03/16/2024    9:16 AM 03/16/2024    9:04 AM  In your present state of health, do you have any difficulty performing the following activities:  Hearing?  0  Vision?  0  Difficulty concentrating or making decisions?  0  Walking or climbing stairs?  0  Dressing or bathing?  0  Doing errands, shopping?  0  Preparing Food and eating ?  N  Using the Toilet?  N  In the past six months, have you accidently leaked urine? Y N  Do you have problems with loss of bowel control? Y N  Managing your Medications?  N  Managing your Finances?  N  Housekeeping or managing your Housekeeping?  N    Patient Care Team: Myrla Jon HERO, MD as PCP - General (Family  Medicine) Mittie Gaskin, MD as Referring Physician (Ophthalmology) Ritter, Kevin J, OD (Optometry)  I have updated your Care Teams any recent Medical Services you may have received from other providers in the past year.     Assessment:   This is a routine wellness examination for Natalie Pena.  Hearing/Vision screen Hearing Screening - Comments:: NO AIDS Vision Screening - Comments:: WEARS GLASSES ALL DAY- DR.RITTER   Goals Addressed             This Visit's Progress    DIET - INCREASE WATER INTAKE         Depression Screen     03/16/2024    9:01 AM 03/16/2024    9:00 AM 01/28/2024    8:20 AM 12/04/2023    9:30 AM 03/12/2023    9:33 AM 02/14/2023   11:34 AM 12/02/2022    9:14 AM  PHQ 2/9 Scores  PHQ - 2 Score 2 0 0 0 1 1 0  PHQ- 9 Score 2 0 4 4 2 6 2     Fall Risk     03/16/2024    9:04 AM 03/12/2023    9:26 AM 02/14/2023   11:34 AM 12/02/2022    9:14 AM 05/09/2022    9:39 AM  Fall Risk   Falls in the past year? 0 1 1 1 1   Number falls in past yr: 0 1 1 1 1   Injury with Fall? 0 1 1 1 1   Risk for fall due to : No Fall Risks No Fall Risks History of fall(s) History of fall(s) History of fall(s)  Follow up Falls evaluation completed;Falls prevention discussed Education provided;Falls prevention discussed Falls evaluation completed Falls evaluation completed Falls evaluation completed;Education provided;Falls prevention discussed      Data saved with a previous flowsheet row definition    MEDICARE RISK AT HOME:  Medicare Risk at Home Any stairs in or around the home?: Yes If so, are there any without handrails?: No Home free of loose throw rugs in walkways, pet beds, electrical cords, etc?: Yes Adequate lighting in your home to reduce risk of falls?: Yes Life alert?: No Use of a cane, walker  or w/c?: No Grab bars in the bathroom?: Yes Shower chair or bench in shower?: No Elevated toilet seat or a handicapped toilet?: No  TIMED UP AND GO:  Was the test performed?   No  Cognitive Function: 6CIT completed        03/16/2024    9:07 AM 03/12/2023    9:39 AM 02/27/2022   10:08 AM 03/13/2018    8:51 AM  6CIT Screen  What Year? 0 points 0 points 0 points 0 points  What month? 0 points 0 points 0 points 0 points  What time? 0 points 0 points 0 points 0 points  Count back from 20 0 points 0 points 0 points 0 points  Months in reverse 0 points 0 points 0 points 0 points  Repeat phrase 0 points 0 points 2 points 2 points  Total Score 0 points 0 points 2 points 2 points    Immunizations Immunization History  Administered Date(s) Administered   Fluad Quad(high Dose 65+) 03/28/2022   INFLUENZA, HIGH DOSE SEASONAL PF 04/11/2017   Influenza, Seasonal, Injecte, Preservative Fre 03/30/2019   Influenza-Unspecified 05/07/2021   PFIZER Comirnaty(Gray Top)Covid-19 Tri-Sucrose Vaccine 08/25/2019, 09/15/2019, 06/06/2020, 02/13/2021   PFIZER(Purple Top)SARS-COV-2 Vaccination 08/25/2019, 09/15/2019, 06/06/2020, 02/13/2021   Pneumococcal Conjugate-13 12/29/2013   Pneumococcal Polysaccharide-23 07/22/2005   Tdap 08/09/2010, 12/17/2022    Screening Tests Health Maintenance  Topic Date Due   Zoster Vaccines- Shingrix (1 of 2) Never done   COVID-19 Vaccine (9 - 2024-25 season) 03/23/2023   INFLUENZA VACCINE  02/20/2024   Medicare Annual Wellness (AWV)  03/16/2025   DTaP/Tdap/Td (3 - Td or Tdap) 12/16/2032   Pneumococcal Vaccine: 50+ Years  Completed   DEXA SCAN  Completed   HPV VACCINES  Aged Out   Meningococcal B Vaccine  Aged Out    Health Maintenance  Health Maintenance Due  Topic Date Due   Zoster Vaccines- Shingrix (1 of 2) Never done   COVID-19 Vaccine (9 - 2024-25 season) 03/23/2023   INFLUENZA VACCINE  02/20/2024   Health Maintenance Items Addressed: UP TO DATE ON TDAP, NEEDS PNA,COVID, SHINGRIX; AGED OUT OF MAMMOGRAM, COLONOSCOPY & BDS   Additional Screening:  Vision Screening: Recommended annual ophthalmology exams for early detection of  glaucoma and other disorders of the eye. Would you like a referral to an eye doctor? No    Dental Screening: Recommended annual dental exams for proper oral hygiene  Community Resource Referral / Chronic Care Management: CRR required this visit?  No   CCM required this visit?  No   Plan:    I have personally reviewed and noted the following in the patient's chart:   Medical and social history Use of alcohol, tobacco or illicit drugs  Current medications and supplements including opioid prescriptions. Patient is not currently taking opioid prescriptions. Functional ability and status Nutritional status Physical activity Advanced directives List of other physicians Hospitalizations, surgeries, and ER visits in previous 12 months Vitals Screenings to include cognitive, depression, and falls Referrals and appointments  In addition, I have reviewed and discussed with patient certain preventive protocols, quality metrics, and best practice recommendations. A written personalized care plan for preventive services as well as general preventive health recommendations were provided to patient.   Jhonnie GORMAN Das, LPN   [id number]   After Visit Summary: (MyChart) Due to this being a telephonic visit, the after visit summary with patients personalized plan was offered to patient via MyChart   Notes: Nothing significant to report at this time.

## 2024-12-09 ENCOUNTER — Encounter: Admitting: Family Medicine

## 2025-03-22 ENCOUNTER — Ambulatory Visit
